# Patient Record
Sex: Female | Born: 1960 | Race: White | Hispanic: No | Marital: Married | Smoking: Never smoker
Health system: Southern US, Community
[De-identification: ages and names within clinical notes are randomized; demographics above are authoritative.]

## PROBLEM LIST (undated history)

## (undated) DIAGNOSIS — E785 Hyperlipidemia, unspecified: Secondary | ICD-10-CM

## (undated) DIAGNOSIS — G43909 Migraine, unspecified, not intractable, without status migrainosus: Secondary | ICD-10-CM

## (undated) HISTORY — DX: Migraine, unspecified, not intractable, without status migrainosus: G43.909

## (undated) HISTORY — DX: Hyperlipidemia, unspecified: E78.5

## (undated) HISTORY — PX: NO PAST SURGERIES: SHX2092

---

## 2011-06-25 ENCOUNTER — Ambulatory Visit (INDEPENDENT_AMBULATORY_CARE_PROVIDER_SITE_OTHER): Payer: BC Managed Care – PPO

## 2011-06-25 DIAGNOSIS — J111 Influenza due to unidentified influenza virus with other respiratory manifestations: Secondary | ICD-10-CM

## 2011-11-11 ENCOUNTER — Ambulatory Visit (INDEPENDENT_AMBULATORY_CARE_PROVIDER_SITE_OTHER): Payer: BC Managed Care – PPO | Admitting: Family Medicine

## 2011-11-11 VITALS — BP 137/100 | HR 90 | Temp 98.1°F | Resp 18

## 2011-11-11 DIAGNOSIS — N920 Excessive and frequent menstruation with regular cycle: Secondary | ICD-10-CM

## 2011-11-11 LAB — POCT CBC
HCT, POC: 42.2 % (ref 37.7–47.9)
Hemoglobin: 13.5 g/dL (ref 12.2–16.2)
Lymph, poc: 2.2 (ref 0.6–3.4)
MCH, POC: 28.2 pg (ref 27–31.2)
MCHC: 32 g/dL (ref 31.8–35.4)
MCV: 88 fL (ref 80–97)
MPV: 10 fL (ref 0–99.8)
POC MID %: 5 %M (ref 0–12)
WBC: 6 10*3/uL (ref 4.6–10.2)

## 2011-11-11 NOTE — Progress Notes (Signed)
Subjective: 51 year old lady with history of having her menstrual cycle start yesterday. She usually goes about 21 days between periods, this time was 19. She usually bleeds for about 4 days. A couple times a year she may have heavy., But this is unusual. 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 She passed some very large clots, and bled too heavy to keep a tampon in. She had to use a towel to catch the blood. This morning she still past a large amount, though over the last couple of hours it seems to have slowed down. She has not had major pain but she has had some soreness in her low abdomen there is cramping with this. She has not really taken anything much for it for fear of precipitating more bleeding. She's not any any other systemic symptoms such as fever, abdominal pain, nausea or vomiting, or urinary symptoms.  She has talked with her gynecologist the past not doing something to stop her menses, and wishes to discuss little bit more.  Objective: Alert, color good. Repeat blood pressure 130/94 abdomen soft, nontender. No CVA tenderness. Pelvic exam not done at this time.  Assessment: Excessive menses  Plan: Check CBC, then decide her recommendations.   Results for orders placed in visit on 11/11/11  POCT CBC      Component Value Range   WBC 6.0  4.6 - 10.2 (K/uL)   Lymph, poc 2.2  0.6 - 3.4    POC LYMPH PERCENT 36.0  10 - 50 (%L)   MID (cbc) 0.3  0 - 0.9    POC MID % 5.0  0 - 12 (%M)   POC Granulocyte 3.5  2 - 6.9    Granulocyte percent 59.0  37 - 80 (%G)   RBC 4.79  4.04 - 5.48 (M/uL)   Hemoglobin 13.5  12.2 - 16.2 (g/dL)   HCT, POC 40.9  81.1 - 47.9 (%)   MCV 88.0  80 - 97 (fL)   MCH, POC 28.2  27 - 31.2 (pg)   MCHC 32.0  31.8 - 35.4 (g/dL)   RDW, POC 91.4     Platelet Count, POC 404  142 - 424 (K/uL)   MPV 10.0  0 - 99.8 (fL)   Discussed the option of endometrial ablation with the patient. She is going to think about that. Reassured her  that her hemoglobin is safe right now, and I do not believe any treatment is necessary today at this time. I will be around some hours for the next for 5 days in the office in his chest problems or concerns I can help with she is to let me know. We could give her around of Provera if she started bleeding more heavily again, but I do not think that is needed at this time.

## 2011-11-11 NOTE — Patient Instructions (Signed)

## 2012-05-04 ENCOUNTER — Ambulatory Visit (INDEPENDENT_AMBULATORY_CARE_PROVIDER_SITE_OTHER): Payer: BC Managed Care – PPO | Admitting: Physician Assistant

## 2012-05-04 VITALS — BP 122/83 | HR 88 | Temp 97.5°F | Resp 16 | Ht 67.0 in | Wt 170.0 lb

## 2012-05-04 DIAGNOSIS — G43909 Migraine, unspecified, not intractable, without status migrainosus: Secondary | ICD-10-CM | POA: Insufficient documentation

## 2012-05-04 DIAGNOSIS — J329 Chronic sinusitis, unspecified: Secondary | ICD-10-CM

## 2012-05-04 DIAGNOSIS — R51 Headache: Secondary | ICD-10-CM

## 2012-05-04 MED ORDER — IPRATROPIUM BROMIDE 0.03 % NA SOLN: 2.0000 | Freq: Two times a day (BID) | NASAL | Status: DC

## 2012-05-04 MED ORDER — AMOXICILLIN-POT CLAVULANATE 875-125 MG PO TABS: 1.0000 | ORAL_TABLET | Freq: Two times a day (BID) | ORAL | Status: DC

## 2012-05-04 NOTE — Patient Instructions (Signed)
Get plenty of rest and drink at least 64 ounces of water daily. 

## 2012-05-04 NOTE — Progress Notes (Signed)
  Subjective:    Patient ID: Candice Bush, female    DOB: 06/18/1961, 51 y.o.   MRN: [id number]  HPI This 51 y.o. female presents for evaluation of illness. Began 10/30 with a HA, which she thought was a migraine.  Nausea, sharp, shooting pain in the temples, sensitive to smells and light.  Symptoms primarily on the RIGHT side initially, now bilaterally and associated with sinus pressure, facial pain, dental pain. Bilateral ear pain. OTC products without benefit. No sore throat, no cough.  Has noted that her mucous is now colored.    Review of Systems As above.   Past Medical History  Diagnosis Date   Migraine Headache   . Hyperlipidemia     History reviewed. No pertinent past surgical history.  Prior to Admission medications   Medication Sig Start Date End Date Taking? Authorizing Provider  aspirin 81 MG tablet Take 81 mg by mouth daily.   Yes Historical Provider, MD  dextromethorphan-guaiFENesin (MUCINEX DM) 30-600 MG per 12 hr tablet Take 1 tablet by mouth every 12 (twelve) hours.   Yes Historical Provider, MD  mometasone (NASONEX) 50 MCG/ACT nasal spray Place 2 sprays into the nose daily.   Yes Historical Provider, MD  fish oil-omega-3 fatty acids 1000 MG capsule Take 2 g by mouth daily.    Historical Provider, MD    No Known Allergies  History   Social History  . Marital Status: Married    Number of Children: 0   Occupational History  . Teacher-Reading coach     [employer]   Social History Main Topics  . Smoking status: Never Smoker   . Smokeless tobacco: Never Used  . Alcohol Use: Yes     occasionally  . Drug Use: No  . Sexually Active: Not Currently   Other Topics Concern  . Not on file   Social History Narrative   Lives with husband, who is a Curator with [employer].    Family History  Problem Relation Age of Onset  . Hypertension Mother   . Hyperlipidemia Mother   . Hypertension Sister        Objective:   Physical Exam  Blood pressure  122/83, pulse 88, temperature 97.5 F (36.4 C), temperature source Oral, resp. rate 16, height 5\' 7"  (1.702 m), weight 170 lb (77.111 kg), last menstrual period 04/13/2012, SpO2 100.00%. Body mass index is 26.63 kg/(m^2). Well-developed, well nourished WF who is awake, alert and oriented, in NAD. HEENT: [place]/AT, PERRL, EOMI.  Sclera and conjunctiva are clear.  Fundi are normal bilaterally. EAC are patent, TMs are normal in appearance. Nasal mucosa is pink and moist. OP is clear. Mild frontal and maxillary sinus tenderness bilaterally. Neck: supple, non-tender, no lymphadenopathy, thyromegaly. Heart: RRR, no murmur Lungs: normal effort, CTA Extremities: no cyanosis, clubbing or edema. Skin: warm and dry without rash. Psychologic: good mood and appropriate affect, normal speech and behavior.     Assessment & Plan:   1. HA (headache)    2. Sinusitis  ipratropium (ATROVENT) 0.03 % nasal spray, amoxicillin-clavulanate (AUGMENTIN) 875-125 MG per tablet

## 2013-06-03 ENCOUNTER — Other Ambulatory Visit: Payer: Self-pay | Admitting: Physician Assistant

## 2018-01-04 ENCOUNTER — Telehealth: Payer: Self-pay | Admitting: *Deleted

## 2018-01-04 NOTE — Telephone Encounter (Signed)
Received Medical records from [facility];  forwarded to provider/SLS 07/05

## 2018-01-17 ENCOUNTER — Ambulatory Visit: Payer: Self-pay | Admitting: Family Medicine

## 2018-01-28 ENCOUNTER — Ambulatory Visit: Payer: Self-pay | Admitting: Family Medicine

## 2018-02-01 ENCOUNTER — Ambulatory Visit: Payer: Self-pay | Admitting: Family Medicine

## 2018-02-14 ENCOUNTER — Encounter: Payer: Self-pay | Admitting: Family Medicine

## 2018-02-14 ENCOUNTER — Ambulatory Visit: Payer: BC Managed Care – PPO | Admitting: Family Medicine

## 2018-02-14 VITALS — BP 118/86 | HR 81 | Temp 98.6°F | Ht 67.0 in | Wt 179.5 lb

## 2018-02-14 DIAGNOSIS — M79644 Pain in right finger(s): Secondary | ICD-10-CM | POA: Insufficient documentation

## 2018-02-14 DIAGNOSIS — F411 Generalized anxiety disorder: Secondary | ICD-10-CM | POA: Diagnosis not present

## 2018-02-14 MED ORDER — SERTRALINE HCL 50 MG PO TABS
50.0000 mg | ORAL_TABLET | Freq: Every day | ORAL | 3 refills | Status: DC
Start: 2018-02-14 — End: 2018-04-22

## 2018-02-14 NOTE — Patient Instructions (Addendum)
Please consider counseling. Contact [phone] to schedule an appointment or inquire about cost/insurance coverage.  Coping skills Choose 5 that work for you:  Take a deep breath  Count to 20  Read a book  Do a puzzle  Meditate  Bake  Sing  Knit  Garden  Pray  Go outside  Call a friend  Listen to music  Take a walk  Color  Send a note  Take a bath  Watch a movie  Be alone in a quiet place  Pet an animal  Visit a friend  Journal  Exercise  Stretch   Let us know if you need anything.  

## 2018-02-14 NOTE — Progress Notes (Signed)
Pre visit review using our clinic review tool, if applicable. No additional management support is needed unless otherwise documented below in the visit note. 

## 2018-02-14 NOTE — Progress Notes (Signed)
Chief Complaint  Patient presents with  . New Patient (Initial Visit)       New Patient Visit SUBJECTIVE: HPI: Candice Bush is an 57 y.o.female who is being seen for establishing care.  Previously seen only at GYN.  +hx of anxiety; having palpitations, never relaxed, impulsive, tense, grinds  Teeth, psychomotor agitation. Started after college. Will sometimes have racing thoughts that affect sleep. This has been getting worse with age. Famhx-none; Stressors- worries about dog, increased responsibility. Has never been treated in the past. Has not tried any OTC supp/options. Does not follow with a counselor or psychologist.  Hx of OA of R thumb. Using Aleve. Was given 3 mo supply of prednisone from [facility] (?). Has seen ortho for this, no surg on horizon at this time. Has had injections that did not help and caused considerable pain for her.    No Known Allergies  Past Medical History:  Diagnosis Date  . Hyperlipidemia   . Migraine    History reviewed. No pertinent surgical history. Family History  Problem Relation Age of Onset  . Hypertension Mother   . Hyperlipidemia Mother   . Alzheimer's disease Mother   . Cancer Mother        colon cancer  . Hypertension Sister    No Known Allergies  Current Outpatient Medications:  .  aspirin 81 MG tablet, Take 81 mg by mouth daily., Disp: , Rfl:  .  dextromethorphan-guaiFENesin (MUCINEX DM) 30-600 MG per 12 hr tablet, Take 1 tablet by mouth every 12 (twelve) hours., Disp: , Rfl:  .  fish oil-omega-3 fatty acids 1000 MG capsule, Take 2 g by mouth daily., Disp: , Rfl:  .  fluticasone (FLONASE) 50 MCG/ACT nasal spray, Place into both nostrils daily., Disp: , Rfl:  .  sertraline (ZOLOFT) 50 MG tablet, Take 1 tablet (50 mg total) by mouth daily. Take 1/2 tab daily for first 2 weeks., Disp: 30 tablet, Rfl: 3   ROS MSK: +thumb pain  Psych: +anxiety   OBJECTIVE: BP 118/86 (BP Location: Left Arm, Patient Position: Sitting, Cuff Size: Normal)    Pulse 81   Temp 98.6 F (37 C) (Oral)   Ht 5\' 7"  (1.702 m)   Wt 179 lb 8 oz (81.4 kg)   SpO2 98%   BMI 28.11 kg/m   Constitutional: -  VS reviewed -  Well developed, well nourished, appears stated age -  No apparent distress  Psychiatric: -  Oriented to person, place, and time -  Memory intact -  Affect and mood normal -  Fluent conversation, good eye contact -  Judgment and insight age appropriate  Eye: -  Conjunctivae clear, no discharge -  Pupils symmetric, round, reactive to light  ENMT: -  MMM    Pharynx moist, no exudate, no erythema  Neck: -  No gross swelling, no palpable masses -  Thyroid midline, not enlarged, mobile, no palpable masses  Cardiovascular: -  RRR -  No LE edema  Respiratory: -  Normal respiratory effort, no accessory muscle use, no retraction -  Breath sounds equal, no wheezes, no ronchi, no crackles  Neurological:  -  CN II - XII grossly intact -  DTR's equal and symmetric -  Sensation grossly intact to light touch, equal bilaterally  Musculoskeletal: -  No clubbing, no cyanosis -  5/5 strength throughout -  Gait normal  Skin: -  No significant lesion on inspection -  Warm and dry to palpation   ASSESSMENT/PLAN: GAD (generalized anxiety  disorder) - Plan: sertraline (ZOLOFT) 50 MG tablet  Pain of right thumb  Patient instructed to sign release of records form from her previous PCP. Start SSRI, 25 mg/d for 2 weeks then 50 mg/d. Counseled on common potential side effects. # for LB BH provided as well as anxiety coping mechs. Counseled on exercise. Offered Mobic, pt would like to stay with Aleve and let us know if anything changes. Patient should return in 6 weeks. The patient voiced understanding and agreement to the plan.   Jilda Rocheicholas Paul [place], DO 02/14/18  8:09 AM

## 2018-04-02 ENCOUNTER — Telehealth: Payer: Self-pay | Admitting: Family Medicine

## 2018-04-02 MED ORDER — VALACYCLOVIR HCL 500 MG PO TABS: ORAL_TABLET | ORAL | 1 refills | Status: DC

## 2018-04-02 NOTE — Telephone Encounter (Signed)
Requesting Valtrex for fever blister, as prescribed by her previous provider. She forgot to mention to Dr. Carmelia Roller last OV to establish care on 02/14/18.

## 2018-04-02 NOTE — Telephone Encounter (Signed)
Copied from CRM [id number]. Topic: Quick Communication - Rx Refill/Question >> Apr 02, 2018 11:50 AM Mickel Baas B, NT wrote: **States that her previous doctor prescribed this medication for fever blisters, but forgot to tell Dr Carmelia Roller about this prescription. Unsure what the MG is because she is at work and does not have it in front of her.**  Medication: Valtrex for fever blister  Has the patient contacted their pharmacy? No. New to Dr Carmelia Roller (Agent: If no, request that the patient contact the pharmacy for the refill.) (Agent: If yes, when and what did the pharmacy advise?)  Preferred Pharmacy (with phone number or street name): [id number] - [place], [place] - [address]  Agent: Please be advised that RX refills may take up to 3 business days. We ask that you follow-up with your pharmacy.

## 2018-04-02 NOTE — Telephone Encounter (Signed)
Ordered

## 2018-04-02 NOTE — Telephone Encounter (Signed)
Pt. Requesting valtrex.

## 2018-04-08 ENCOUNTER — Ambulatory Visit: Payer: BC Managed Care – PPO | Admitting: Family Medicine

## 2018-04-22 ENCOUNTER — Encounter: Payer: Self-pay | Admitting: Family Medicine

## 2018-04-22 ENCOUNTER — Ambulatory Visit: Payer: BC Managed Care – PPO | Admitting: Family Medicine

## 2018-04-22 VITALS — BP 120/84 | HR 84 | Temp 97.6°F | Ht 67.0 in | Wt 180.1 lb

## 2018-04-22 DIAGNOSIS — H6591 Unspecified nonsuppurative otitis media, right ear: Secondary | ICD-10-CM | POA: Diagnosis not present

## 2018-04-22 DIAGNOSIS — F411 Generalized anxiety disorder: Secondary | ICD-10-CM

## 2018-04-22 MED ORDER — PREDNISONE 20 MG PO TABS: 40.0000 mg | ORAL_TABLET | Freq: Every day | ORAL | 0 refills | Status: AC

## 2018-04-22 MED ORDER — SERTRALINE HCL 25 MG PO TABS: 25.0000 mg | ORAL_TABLET | Freq: Every day | ORAL | 1 refills | Status: DC

## 2018-04-22 NOTE — Patient Instructions (Addendum)
Think about if/when you would like to come off of the Zoloft.   If you want to come off of it, schedule an appt in around 3 mo (January).  Continue on Flonase.  Let us know if you need anything.

## 2018-04-22 NOTE — Progress Notes (Signed)
Pre visit review using our clinic review tool, if applicable. No additional management support is needed unless otherwise documented below in the visit note. 

## 2018-04-22 NOTE — Progress Notes (Signed)
Chief Complaint  Patient presents with  . Follow-up    Subjective Candice Bush presents for f/u anxiety.  She is currently being treated with Zoloft 50 mg/d, started 6 weeks ago.  Reports good improvement since treatment- best with 25 mg/d No thoughts of harming self or others. No self-medication with alcohol, prescription drugs or illicit drugs. Pt is not following with a counselor/psychologist.  Several d hx of b/l ear pain, worse on L. Does use INCS daily. Travels soon. No fevers or other URI s/s's.   ROS Psych: No homicidal or suicidal thoughts HEENT: +ear pain  Past Medical History:  Diagnosis Date  . Hyperlipidemia   . Migraine    Exam BP 120/84 (BP Location: Left Arm, Patient Position: Sitting, Cuff Size: Normal)   Pulse 84   Temp 97.6 F (36.4 C) (Oral)   Ht 5\' 7"  (1.702 m)   Wt 180 lb 2 oz (81.7 kg)   SpO2 95%   BMI 28.21 kg/m  General:  well developed, well nourished, in no apparent distress HEENT: Ear canals neg, +serous fluid on R, some serous fluid on L, no bulging or erythema. Nares patent. Pharynx pink w/o exudates. Neck: neck supple without adenopathy, thyromegaly, or masses Lungs:  clear to auscultation, breath sounds equal bilaterally, no respiratory distress Cardio:  regular rate and rhythm without murmurs, heart sounds without clicks or rubs Psych: well oriented with normal range of affect and age-appropriate judgement/insight, alert and oriented x4.  Assessment and Plan  GAD (generalized anxiety disorder) - Plan: sertraline (ZOLOFT) 25 MG tablet  Fluid level behind tympanic membrane of right ear - Plan: predniSONE (DELTASONE) 20 MG tablet  OK to stay on 25 mg Zoloft daily. Suggested she think about coming off. I will plan to see in 6 mo, return in 3 if she would like to come off of med.  Cont INCS, 5 d pred burst for ears.  F/u in 6 mo for CPE. The patient voiced understanding and agreement to the plan.  Jilda Roche [place],  DO 04/22/18 4:24 PM

## 2018-05-07 ENCOUNTER — Encounter: Payer: Self-pay | Admitting: Family Medicine

## 2018-08-04 ENCOUNTER — Encounter: Payer: Self-pay | Admitting: Family Medicine

## 2018-10-31 ENCOUNTER — Telehealth: Payer: Self-pay

## 2018-10-31 ENCOUNTER — Telehealth: Payer: Self-pay | Admitting: Family Medicine

## 2018-10-31 NOTE — Telephone Encounter (Signed)
Pt returned call. Pt has been scheduled for July.

## 2018-10-31 NOTE — Telephone Encounter (Signed)
Copied from CRM [id number]. Topic: General - Other >> Oct 31, 2018  3:22 PM Trula Slade wrote: Reason for CRM:  Patient would like to set up a physical at the end of July.  Tried 3 times to reach office.

## 2018-10-31 NOTE — Telephone Encounter (Signed)
LVM for patient to return call to schedule annual physical and lab work for sometime in July per [employer].

## 2018-10-31 NOTE — Telephone Encounter (Signed)
Returned patients call for appointment scheduling with Dr. Carmelia Roller. Left message on both answering machines.

## 2018-11-18 ENCOUNTER — Other Ambulatory Visit: Payer: Self-pay | Admitting: Family Medicine

## 2018-11-18 DIAGNOSIS — F411 Generalized anxiety disorder: Secondary | ICD-10-CM

## 2019-01-27 ENCOUNTER — Ambulatory Visit (INDEPENDENT_AMBULATORY_CARE_PROVIDER_SITE_OTHER): Payer: BC Managed Care – PPO | Admitting: Family Medicine

## 2019-01-27 ENCOUNTER — Other Ambulatory Visit: Payer: Self-pay

## 2019-01-27 ENCOUNTER — Encounter: Payer: BC Managed Care – PPO | Admitting: Family Medicine

## 2019-01-27 ENCOUNTER — Encounter: Payer: Self-pay | Admitting: Family Medicine

## 2019-01-27 VITALS — BP 110/68 | HR 77 | Temp 98.2°F | Ht 66.0 in | Wt 154.5 lb

## 2019-01-27 DIAGNOSIS — Z114 Encounter for screening for human immunodeficiency virus [HIV]: Secondary | ICD-10-CM

## 2019-01-27 DIAGNOSIS — Z0000 Encounter for general adult medical examination without abnormal findings: Secondary | ICD-10-CM

## 2019-01-27 DIAGNOSIS — Z1159 Encounter for screening for other viral diseases: Secondary | ICD-10-CM | POA: Diagnosis not present

## 2019-01-27 LAB — CBC
HCT: 41.9 % (ref 36.0–46.0)
Hemoglobin: 14 g/dL (ref 12.0–15.0)
MCHC: 33.4 g/dL (ref 30.0–36.0)
MCV: 88.7 fl (ref 78.0–100.0)
Platelets: 281 10*3/uL (ref 150.0–400.0)
RBC: 4.72 Mil/uL (ref 3.87–5.11)
RDW: 12.9 % (ref 11.5–15.5)
WBC: 4 10*3/uL (ref 4.0–10.5)

## 2019-01-27 LAB — COMPREHENSIVE METABOLIC PANEL
ALT: 15 U/L (ref 0–35)
AST: 15 U/L (ref 0–37)
Albumin: 4.5 g/dL (ref 3.5–5.2)
Alkaline Phosphatase: 52 U/L (ref 39–117)
BUN: 16 mg/dL (ref 6–23)
CO2: 30 mEq/L (ref 19–32)
Calcium: 9.8 mg/dL (ref 8.4–10.5)
Chloride: 105 mEq/L (ref 96–112)
Creatinine, Ser: 0.83 mg/dL (ref 0.40–1.20)
GFR: 71.1 mL/min (ref >60.00)
Glucose, Bld: 86 mg/dL (ref 70–99)
Potassium: 4.7 mEq/L (ref 3.5–5.1)
Sodium: 142 mEq/L (ref 135–145)
Total Bilirubin: 0.5 mg/dL (ref 0.2–1.2)
Total Protein: 6.6 g/dL (ref 6.0–8.3)

## 2019-01-27 LAB — LIPID PANEL
Cholesterol: 216 mg/dL — ABNORMAL HIGH (ref 0–200)
HDL: 48.5 mg/dL (ref >39.00)
LDL Cholesterol: 143 mg/dL — ABNORMAL HIGH (ref 0–99)
NonHDL: 167.8
Total CHOL/HDL Ratio: 4
Triglycerides: 124 mg/dL (ref 0.0–149.0)
VLDL: 24.8 mg/dL (ref 0.0–40.0)

## 2019-01-27 NOTE — Progress Notes (Signed)
Chief Complaint  Patient presents with  . Annual Exam     Well Woman Candice Bush is here for a complete physical.   Her last physical was >1 year ago.  Current diet: in general, a "healthy" diet. Current exercise: walking, yoga, wt resistance. Weight is decreasing intentionally and she denies daytime fatigue. No LMP recorded. Seatbelt? Yes  Health Maintenance Pap/HPV- Yes Mammogram- Getting tomorrow Tetanus- Yes Hep C screening- No HIV screening- No  Past Medical History:  Diagnosis Date  . Hyperlipidemia   . Migraine      Past Surgical History:  Procedure Laterality Date  . NO PAST SURGERIES      Medications  Current Outpatient Medications on File Prior to Visit  Medication Sig Dispense Refill  . aspirin 81 MG tablet Take 81 mg by mouth daily.    Marland Kitchen. dextromethorphan-guaiFENesin (MUCINEX DM) 30-600 MG per 12 hr tablet Take 1 tablet by mouth every 12 (twelve) hours.    . fish oil-omega-3 fatty acids 1000 MG capsule Take 2 g by mouth daily.    . fluticasone (FLONASE) 50 MCG/ACT nasal spray Place into both nostrils daily.    . sertraline (ZOLOFT) 25 MG tablet Take 1 tablet (25 mg total) by mouth daily. 90 tablet 1  . valACYclovir (VALTREX) 500 MG tablet Take 2000 mg and repeat dose in 12 hours for fever blister. 30 tablet 1   Allergies No Known Allergies  Review of Systems: Constitutional:  no unexpected weight changes Eye:  no recent significant change in vision Ear/Nose/Mouth/Throat:  Ears:  no tinnitus or vertigo and no recent change in hearing Nose/Mouth/Throat:  no complaints of nasal congestion, no sore throat Cardiovascular: no chest pain Respiratory:  no cough and no shortness of breath Gastrointestinal:  no abdominal pain, no change in bowel habits GU:  Female: negative for dysuria or pelvic pain Musculoskeletal/Extremities: +knee buckling b/l; otherwise no pain of the joints Integumentary (Skin/Breast):  no abnormal skin lesions reported Neurologic:  no  headaches Endocrine:  denies fatigue Hematologic/Lymphatic:  + various areas of easy bruising Psych: +insomnia  Exam BP 110/68 (BP Location: Left Arm, Patient Position: Sitting, Cuff Size: Normal)   Pulse 77   Temp 98.2 F (36.8 C)   Ht 5\' 6"  (1.676 m)   Wt 154 lb 8 oz (70.1 kg)   SpO2 98%   BMI 24.94 kg/m  General:  well developed, well nourished, in no apparent distress Skin:  no significant moles, warts, or growths Head:  no masses, lesions, or tenderness Eyes:  pupils equal and round, sclera anicteric without injection Ears:  canals without lesions, TMs shiny without retraction, no obvious effusion, no erythema Nose:  nares patent, septum midline, mucosa normal, and no drainage or sinus tenderness Throat/Pharynx:  lips and gingiva without lesion; tongue and uvula midline; non-inflamed pharynx; no exudates or postnasal drainage Neck: neck supple without adenopathy, thyromegaly, or masses Lungs:  clear to auscultation, breath sounds equal bilaterally, no respiratory distress Cardio:  regular rate and rhythm, no bruits, no LE edema Abdomen:  abdomen soft, nontender; bowel sounds normal; no masses or organomegaly Genital: Defer to GYN Musculoskeletal:  symmetrical muscle groups noted without atrophy or deformity Extremities:  no clubbing, cyanosis, or edema, no deformities, no skin discoloration Neuro:  gait normal; deep tendon reflexes normal and symmetric Psych: well oriented with normal range of affect and appropriate judgment/insight  Assessment and Plan  Well adult exam - Plan: CBC, Comprehensive metabolic panel, Lipid panel  Screening for HIV (human immunodeficiency virus) -  Plan: HIV Antibody (routine testing w rflx)  Encounter for hepatitis C screening test for low risk patient - Plan: Hepatitis C antibody   Well 58 y.o. female. Counseled on diet and exercise. She blames insomnia on racing thoughts from upcoming retirement. Will revisit in fall if still having  issues. Will give stretches/exercises for buckling knees. She has had eval around 1 mo ago and had neg workup. No pain.  Has come off of Zoloft, doing well (maybe other than the sleep). Offered med for sleep, preferred to see how things go after she retires on 9/1.  Other orders as above. Follow up in 1 yr or prn. The patient voiced understanding and agreement to the plan.  [place], DO 01/27/19 8:27 AM

## 2019-01-27 NOTE — Patient Instructions (Addendum)
Give us 2-3 business days to get the results of your labs back.   Keep the diet clean and stay active.  Let us know if you need anything.  Knee Exercises It is normal to feel mild stretching, pulling, tightness, or discomfort as you do these exercises, but you should stop right away if you feel sudden pain or your pain gets worse. STRETCHING AND RANGE OF MOTION EXERCISES  These exercises warm up your muscles and joints and improve the movement and flexibility of your knee. These exercises also help to relieve pain, numbness, and tingling. Exercise A: Knee Extension, Prone  1. Lie on your abdomen on a bed. 2. Place your left / right knee just beyond the edge of the surface so your knee is not on the bed. You can put a towel under your left / right thigh just above your knee for comfort. 3. Relax your leg muscles and allow gravity to straighten your knee. You should feel a stretch behind your left / right knee. 4. Hold this position for 30 seconds. 5. Scoot up so your knee is supported between repetitions. Repeat 2 times. Complete this stretch 3 times per week. Exercise B: Knee Flexion, Active    1. Lie on your back with both knees straight. If this causes back discomfort, bend your left / right knee so your foot is flat on the floor. 2. Slowly slide your left / right heel back toward your buttocks until you feel a gentle stretch in the front of your knee or thigh. 3. Hold this position for 30 seconds. 4. Slowly slide your left / right heel back to the starting position. Repeat 2 times. Complete this exercise 3 times per week. Exercise C: Quadriceps, Prone    1. Lie on your abdomen on a firm surface, such as a bed or padded floor. 2. Bend your left / right knee and hold your ankle. If you cannot reach your ankle or pant leg, loop a belt around your foot and grab the belt instead. 3. Gently pull your heel toward your buttocks. Your knee should not slide out to the side. You should feel  a stretch in the front of your thigh and knee. 4. Hold this position for 30 seconds. Repeat 2 times. Complete this stretch 3 times per week. Exercise D: Hamstring, Supine  1. Lie on your back. 2. Loop a belt or towel over the ball of your left / right foot. The ball of your foot is on the walking surface, right under your toes. 3. Straighten your left / right knee and slowly pull on the belt to raise your leg until you feel a gentle stretch behind your knee. ? Do not let your left / right knee bend while you do this. ? Keep your other leg flat on the floor. 4. Hold this position for 30 seconds. Repeat 2 times. Complete this stretch 3 times per week. STRENGTHENING EXERCISES  These exercises build strength and endurance in your knee. Endurance is the ability to use your muscles for a long time, even after they get tired. Exercise E: Quadriceps, Isometric    1. Lie on your back with your left / right leg extended and your other knee bent. Put a rolled towel or small pillow under your knee if told by your health care provider. 2. Slowly tense the muscles in the front of your left / right thigh. You should see your kneecap slide up toward your hip or see increased dimpling just   above the knee. This motion will push the back of the knee toward the floor. 3. For 3 seconds, keep the muscle as tight as you can without increasing your pain. 4. Relax the muscles slowly and completely. Repeat for 10 total reps Repeat 2 ti mes. Complete this exercise 3 times per week. Exercise F: Straight Leg Raises - Quadriceps  1. Lie on your back with your left / right leg extended and your other knee bent. 2. Tense the muscles in the front of your left / right thigh. You should see your kneecap slide up or see increased dimpling just above the knee. Your thigh may even shake a bit. 3. Keep these muscles tight as you raise your leg 4-6 inches (10-15 cm) off the floor. Do not let your knee bend. 4. Hold this position  for 3 seconds. 5. Keep these muscles tense as you lower your leg. 6. Relax your muscles slowly and completely after each repetition. 10 total reps. Repeat 2 times. Complete this exercise 3 times per week.  Exercise G: Hamstring Curls    If told by your health care provider, do this exercise while wearing ankle weights. Begin with 5 lb weights (optional). Then increase the weight by 1 lb (0.5 kg) increments. Do not wear ankle weights that are more than 20 lbs to start with. 1. Lie on your abdomen with your legs straight. 2. Bend your left / right knee as far as you can without feeling pain. Keep your hips flat against the floor. 3. Hold this position for 3 seconds. 4. Slowly lower your leg to the starting position. Repeat for 10 reps.  Repeat 2 times. Complete this exercise 3 times per week. Exercise H: Squats (Quadriceps)  1. Stand in front of a table, with your feet and knees pointing straight ahead. You may rest your hands on the table for balance but not for support. 2. Slowly bend your knees and lower your hips like you are going to sit in a chair. ? Keep your weight over your heels, not over your toes. ? Keep your lower legs upright so they are parallel with the table legs. ? Do not let your hips go lower than your knees. ? Do not bend lower than told by your health care provider. ? If your knee pain increases, do not bend as low. 3. Hold the squat position for 1 second. 4. Slowly push with your legs to return to standing. Do not use your hands to pull yourself to standing. Repeat 2 times. Complete this exercise 3 times per week. Exercise I: Wall Slides (Quadriceps)    1. Lean your back against a smooth wall or door while you walk your feet out 18-24 inches (46-61 cm) from it. 2. Place your feet hip-width apart. 3. Slowly slide down the wall or door until your knees Repeat 2 times. Complete this exercise every other day. 4. Exercise K: Straight Leg Raises - Hip Abductors   1. Lie on your side with your left / right leg in the top position. Lie so your head, shoulder, knee, and hip line up. You may bend your bottom knee to help you keep your balance. 2. Roll your hips slightly forward so your hips are stacked directly over each other and your left / right knee is facing forward. 3. Leading with your heel, lift your top leg 4-6 inches (10-15 cm). You should feel the muscles in your outer hip lifting. ? Do not let your foot drift forward. ?   Do not let your knee roll toward the ceiling. 4. Hold this position for 3 seconds. 5. Slowly return your leg to the starting position. 6. Let your muscles relax completely after each repetition. 10 total reps. Repeat 2 times. Complete this exercise 3 times per week. Exercise J: Straight Leg Raises - Hip Extensors  1. Lie on your abdomen on a firm surface. You can put a pillow under your hips if that is more comfortable. 2. Tense the muscles in your buttocks and lift your left / right leg about 4-6 inches (10-15 cm). Keep your knee straight as you lift your leg. 3. Hold this position for 3 seconds. 4. Slowly lower your leg to the starting position. 5. Let your leg relax completely after each repetition. Repeat 2 times. Complete this exercise 3 times per week. Document Released: 05/03/2005 Document Revised: 03/13/2016 Document Reviewed: 04/25/2015 Elsevier Interactive Patient Education  2017 [employer]   

## 2019-01-28 ENCOUNTER — Encounter: Payer: Self-pay | Admitting: Family Medicine

## 2019-01-28 LAB — HIV ANTIBODY (ROUTINE TESTING W REFLEX): HIV 1&2 Ab, 4th Generation: NONREACTIVE

## 2019-01-28 LAB — HEPATITIS C ANTIBODY
Hepatitis C Ab: NONREACTIVE
SIGNAL TO CUT-OFF: 0.01 (ref <1.00)

## 2019-02-05 ENCOUNTER — Other Ambulatory Visit: Payer: Self-pay | Admitting: Obstetrics and Gynecology

## 2019-02-05 DIAGNOSIS — R928 Other abnormal and inconclusive findings on diagnostic imaging of breast: Secondary | ICD-10-CM

## 2019-02-07 ENCOUNTER — Ambulatory Visit
Admission: RE | Admit: 2019-02-07 | Discharge: 2019-02-07 | Disposition: A | Discharge status: Home / Self Care | Payer: BC Managed Care – PPO | Source: Ambulatory Visit | Attending: Obstetrics and Gynecology | Admitting: Obstetrics and Gynecology

## 2019-02-07 ENCOUNTER — Other Ambulatory Visit: Payer: Self-pay

## 2019-02-07 ENCOUNTER — Ambulatory Visit: Payer: BC Managed Care – PPO

## 2019-02-07 DIAGNOSIS — R928 Other abnormal and inconclusive findings on diagnostic imaging of breast: Secondary | ICD-10-CM

## 2019-04-02 ENCOUNTER — Encounter: Payer: Self-pay | Admitting: Family Medicine

## 2019-04-02 ENCOUNTER — Other Ambulatory Visit: Payer: Self-pay

## 2019-04-02 ENCOUNTER — Ambulatory Visit: Payer: BC Managed Care – PPO | Admitting: Family Medicine

## 2019-04-02 VITALS — BP 122/78 | HR 78 | Temp 96.9°F | Resp 16 | Ht 66.0 in | Wt 156.0 lb

## 2019-04-02 DIAGNOSIS — J3489 Other specified disorders of nose and nasal sinuses: Secondary | ICD-10-CM

## 2019-04-02 DIAGNOSIS — T7840XA Allergy, unspecified, initial encounter: Secondary | ICD-10-CM

## 2019-04-02 MED ORDER — PREDNISONE 20 MG PO TABS: 40.0000 mg | ORAL_TABLET | Freq: Every day | ORAL | 0 refills | Status: AC

## 2019-04-02 MED ORDER — MOMETASONE FUROATE 50 MCG/ACT NA SUSP: 2.0000 | Freq: Every day | NASAL | 12 refills | Status: DC

## 2019-04-02 NOTE — Patient Instructions (Addendum)
Continue with the air humidifier.  OK to take Tylenol 1000 mg (2 extra strength tabs) or 975 mg (3 regular strength tabs) every 6 hours as needed.  Continue to push fluids, practice good hand hygiene, and cover your mouth if you cough.  If you start having fevers, shaking or shortness of breath, seek immediate care.  Claritin (loratadine), Allegra (fexofenadine), Zyrtec (cetirizine) which is also equivalent to Xyzal (levocetirizine); these are listed in order from weakest to strongest. Generic, and therefore cheaper, options are in the parentheses.   There are available OTC, and the generic versions, which may be cheaper, are in parentheses. Show this to a pharmacist if you have trouble finding any of these items.  Let us know if you need anything.

## 2019-04-02 NOTE — Progress Notes (Signed)
Chief Complaint  Patient presents with  . Sinus Pressure    more frequent headaches, possible weather related, ear pressure bilateral    Candice Bush here for URI complaints.  Duration: 1.5 weeks  Associated symptoms: sinus headache, sinus congestion, sinus pain and ear fullness Denies: rhinorrhea, itchy watery eyes, ear pain, ear drainage, sore throat, wheezing, shortness of breath, myalgia, cough, and fevers Treatment to date: Mucinex, INCS, humidifier Sick contacts: No  She does have a history of migraines but it was mainly when she was going through menopause.  ROS:  Const: Denies fevers HEENT: As noted in HPI Lungs: No SOB  Past Medical History:  Diagnosis Date  . Hyperlipidemia   . Migraine     BP 122/78 (BP Location: Left Arm, Patient Position: Sitting, Cuff Size: Normal)   Pulse 78   Temp (!) 96.9 F (36.1 C) (Temporal)   Resp 16   Ht 5\' 6"  (1.676 m)   Wt 156 lb (70.8 kg)   SpO2 99%   BMI 25.18 kg/m  General: Awake, alert, appears stated age HEENT: AT, [place], ears patent b/l and TM's neg, nares patent w/o discharge, pharynx pink and without exudates, MMM, sinuses are nontender to palpation Neck: No masses or asymmetry Heart: RRR Lungs: CTAB, no accessory muscle use Neuro: DTRs equal and symmetric, no cerebellar signs, no clonus Psych: Age appropriate judgment and insight, normal mood and affect  Sinus pressure - Plan: predniSONE (DELTASONE) 20 MG tablet  Allergic state, initial encounter - Plan: predniSONE (DELTASONE) 20 MG tablet, mometasone (NASONEX) 50 MCG/ACT nasal spray  I think her issues are more related to allergies.  No suggestive of infection as there is no tenderness over the sinuses.  Unlikely to be a migraine given how far away from menopause she is. A list of oral antihistamine medications were provided in her AVS.  Tylenol as needed. F/u prn. If starting to experience fevers, shaking, or shortness of breath, seek immediate care. Pt voiced  understanding and agreement to the plan.  [place], DO 04/02/19 8:45 AM

## 2019-05-22 ENCOUNTER — Other Ambulatory Visit: Payer: Self-pay | Admitting: Family Medicine

## 2019-12-29 ENCOUNTER — Encounter: Payer: Self-pay | Admitting: Family Medicine

## 2019-12-29 ENCOUNTER — Other Ambulatory Visit: Payer: Self-pay

## 2019-12-29 ENCOUNTER — Ambulatory Visit (INDEPENDENT_AMBULATORY_CARE_PROVIDER_SITE_OTHER): Payer: BC Managed Care – PPO | Admitting: Family Medicine

## 2019-12-29 VITALS — BP 120/68 | HR 71 | Temp 96.0°F | Ht 66.5 in | Wt 160.2 lb

## 2019-12-29 DIAGNOSIS — J3489 Other specified disorders of nose and nasal sinuses: Secondary | ICD-10-CM | POA: Diagnosis not present

## 2019-12-29 MED ORDER — MONTELUKAST SODIUM 10 MG PO TABS: ORAL_TABLET | ORAL | 5 refills | Status: DC

## 2019-12-29 MED ORDER — PREDNISONE 20 MG PO TABS: 40.0000 mg | ORAL_TABLET | Freq: Every day | ORAL | 0 refills | Status: AC

## 2019-12-29 NOTE — Patient Instructions (Signed)
Continue the Nasonex and levocertirizine.   Take the Singulair if you start having increasing sinus pressure/allergies.  If things take a turn for the worse, take the prednisone.  Let us know if you need anything.

## 2019-12-29 NOTE — Progress Notes (Signed)
Chief Complaint  Patient presents with  . Headache  . Sore Throat  . Sinusitis    Subjective: Patient is a 59 y.o. female here for headache.  4 weeks of headache. Had work done on R lower tooth that resolved with dental care though HA's remained. Feels a frontal and b/l HA that felt like a tight band. Worse in AM. Tried Excedrin that helped a lot. Scratchy throat, some ear fullness, compresses, humidified air, allergy meds (Nasonex, Xyzal), netty pot, Mucinex-DM. No fevers or neurologic s/s's.    Past Medical History:  Diagnosis Date  . Hyperlipidemia   . Migraine     Objective: BP 120/68 (BP Location: Left Arm, Patient Position: Sitting, Cuff Size: Normal)   Pulse 71   Temp (!) 96 F (35.6 C) (Temporal)   Ht 5' 6.5" (1.689 m)   Wt 160 lb 4 oz (72.7 kg)   SpO2 97%   BMI 25.48 kg/m  General: Awake, appears stated age HEENT: MMM, slight erythema over soft palate suggestive of allergic changes, EOMi, ears neg b/l, nares patent w/o rhinorrhea Heart: RRR Lungs: CTAB, no rales, wheezes or rhonchi. No accessory muscle use Neuro: DTR's equal and symmetric throughout, no clonus, no cerebellar signs. MSK: no ttp over TMJ, cerv parasp msc, occ triangle, temporalis b/l Psych: Age appropriate judgment and insight, normal affect and mood  Assessment and Plan: Sinus pressure - Plan: montelukast (SINGULAIR) 10 MG tablet, predniSONE (DELTASONE) 20 MG tablet  Pred burst prn. Singulair prn. F/u as originally scheduled.  The patient voiced understanding and agreement to the plan.  Jilda Roche [place], DO 12/29/19  11:33 AM

## 2020-01-28 ENCOUNTER — Encounter: Payer: BC Managed Care – PPO | Admitting: Family Medicine

## 2020-03-22 ENCOUNTER — Other Ambulatory Visit: Payer: Self-pay | Admitting: Obstetrics and Gynecology

## 2020-03-22 DIAGNOSIS — R928 Other abnormal and inconclusive findings on diagnostic imaging of breast: Secondary | ICD-10-CM

## 2020-03-30 ENCOUNTER — Encounter: Payer: Self-pay | Admitting: Family Medicine

## 2020-03-30 ENCOUNTER — Ambulatory Visit (INDEPENDENT_AMBULATORY_CARE_PROVIDER_SITE_OTHER): Payer: BC Managed Care – PPO | Admitting: Family Medicine

## 2020-03-30 ENCOUNTER — Other Ambulatory Visit: Payer: Self-pay

## 2020-03-30 VITALS — BP 110/68 | HR 89 | Temp 98.3°F | Ht 67.0 in | Wt 160.4 lb

## 2020-03-30 DIAGNOSIS — Z0000 Encounter for general adult medical examination without abnormal findings: Secondary | ICD-10-CM

## 2020-03-30 DIAGNOSIS — T7840XA Allergy, unspecified, initial encounter: Secondary | ICD-10-CM

## 2020-03-30 MED ORDER — MOMETASONE FUROATE 50 MCG/ACT NA SUSP: 2.0000 | Freq: Every day | NASAL | 12 refills | Status: DC

## 2020-03-30 MED ORDER — VALACYCLOVIR HCL 500 MG PO TABS: ORAL_TABLET | ORAL | 1 refills | Status: DC

## 2020-03-30 NOTE — Patient Instructions (Addendum)
Give us 2-3 business days to get the results of your labs back.   Keep the diet clean and stay active.  Let us know if you need anything.  Knee Exercises It is normal to feel mild stretching, pulling, tightness, or discomfort as you do these exercises, but you should stop right away if you feel sudden pain or your pain gets worse. STRETCHING AND RANGE OF MOTION EXERCISES  These exercises warm up your muscles and joints and improve the movement and flexibility of your knee. These exercises also help to relieve pain, numbness, and tingling. Exercise A: Knee Extension, Prone  1. Lie on your abdomen on a bed. 2. Place your left / right knee just beyond the edge of the surface so your knee is not on the bed. You can put a towel under your left / right thigh just above your knee for comfort. 3. Relax your leg muscles and allow gravity to straighten your knee. You should feel a stretch behind your left / right knee. 4. Hold this position for 30 seconds. 5. Scoot up so your knee is supported between repetitions. Repeat 2 times. Complete this stretch 3 times per week. Exercise B: Knee Flexion, Active    1. Lie on your back with both knees straight. If this causes back discomfort, bend your left / right knee so your foot is flat on the floor. 2. Slowly slide your left / right heel back toward your buttocks until you feel a gentle stretch in the front of your knee or thigh. 3. Hold this position for 30 seconds. 4. Slowly slide your left / right heel back to the starting position. Repeat 2 times. Complete this exercise 3 times per week. Exercise C: Quadriceps, Prone    1. Lie on your abdomen on a firm surface, such as a bed or padded floor. 2. Bend your left / right knee and hold your ankle. If you cannot reach your ankle or pant leg, loop a belt around your foot and grab the belt instead. 3. Gently pull your heel toward your buttocks. Your knee should not slide out to the side. You should feel  a stretch in the front of your thigh and knee. 4. Hold this position for 30 seconds. Repeat 2 times. Complete this stretch 3 times per week. Exercise D: Hamstring, Supine  1. Lie on your back. 2. Loop a belt or towel over the ball of your left / right foot. The ball of your foot is on the walking surface, right under your toes. 3. Straighten your left / right knee and slowly pull on the belt to raise your leg until you feel a gentle stretch behind your knee. ? Do not let your left / right knee bend while you do this. ? Keep your other leg flat on the floor. 4. Hold this position for 30 seconds. Repeat 2 times. Complete this stretch 3 times per week. STRENGTHENING EXERCISES  These exercises build strength and endurance in your knee. Endurance is the ability to use your muscles for a long time, even after they get tired. Exercise E: Quadriceps, Isometric    1. Lie on your back with your left / right leg extended and your other knee bent. Put a rolled towel or small pillow under your knee if told by your health care provider. 2. Slowly tense the muscles in the front of your left / right thigh. You should see your kneecap slide up toward your hip or see increased dimpling just   above the knee. This motion will push the back of the knee toward the floor. 3. For 3 seconds, keep the muscle as tight as you can without increasing your pain. 4. Relax the muscles slowly and completely. Repeat for 10 total reps Repeat 2 ti mes. Complete this exercise 3 times per week. Exercise F: Straight Leg Raises - Quadriceps  1. Lie on your back with your left / right leg extended and your other knee bent. 2. Tense the muscles in the front of your left / right thigh. You should see your kneecap slide up or see increased dimpling just above the knee. Your thigh may even shake a bit. 3. Keep these muscles tight as you raise your leg 4-6 inches (10-15 cm) off the floor. Do not let your knee bend. 4. Hold this position  for 3 seconds. 5. Keep these muscles tense as you lower your leg. 6. Relax your muscles slowly and completely after each repetition. 10 total reps. Repeat 2 times. Complete this exercise 3 times per week.  Exercise G: Hamstring Curls    If told by your health care provider, do this exercise while wearing ankle weights. Begin with 5 lb weights (optional). Then increase the weight by 1 lb (0.5 kg) increments. Do not wear ankle weights that are more than 20 lbs to start with. 1. Lie on your abdomen with your legs straight. 2. Bend your left / right knee as far as you can without feeling pain. Keep your hips flat against the floor. 3. Hold this position for 3 seconds. 4. Slowly lower your leg to the starting position. Repeat for 10 reps.  Repeat 2 times. Complete this exercise 3 times per week. Exercise H: Squats (Quadriceps)  1. Stand in front of a table, with your feet and knees pointing straight ahead. You may rest your hands on the table for balance but not for support. 2. Slowly bend your knees and lower your hips like you are going to sit in a chair. ? Keep your weight over your heels, not over your toes. ? Keep your lower legs upright so they are parallel with the table legs. ? Do not let your hips go lower than your knees. ? Do not bend lower than told by your health care provider. ? If your knee pain increases, do not bend as low. 3. Hold the squat position for 1 second. 4. Slowly push with your legs to return to standing. Do not use your hands to pull yourself to standing. Repeat 2 times. Complete this exercise 3 times per week. Exercise I: Wall Slides (Quadriceps)    1. Lean your back against a smooth wall or door while you walk your feet out 18-24 inches (46-61 cm) from it. 2. Place your feet hip-width apart. 3. Slowly slide down the wall or door until your knees Repeat 2 times. Complete this exercise every other day. 4. Exercise K: Straight Leg Raises - Hip Abductors   1. Lie on your side with your left / right leg in the top position. Lie so your head, shoulder, knee, and hip line up. You may bend your bottom knee to help you keep your balance. 2. Roll your hips slightly forward so your hips are stacked directly over each other and your left / right knee is facing forward. 3. Leading with your heel, lift your top leg 4-6 inches (10-15 cm). You should feel the muscles in your outer hip lifting. ? Do not let your foot drift forward. ?   Do not let your knee roll toward the ceiling. 4. Hold this position for 3 seconds. 5. Slowly return your leg to the starting position. 6. Let your muscles relax completely after each repetition. 10 total reps. Repeat 2 times. Complete this exercise 3 times per week. Exercise J: Straight Leg Raises - Hip Extensors  1. Lie on your abdomen on a firm surface. You can put a pillow under your hips if that is more comfortable. 2. Tense the muscles in your buttocks and lift your left / right leg about 4-6 inches (10-15 cm). Keep your knee straight as you lift your leg. 3. Hold this position for 3 seconds. 4. Slowly lower your leg to the starting position. 5. Let your leg relax completely after each repetition. Repeat 2 times. Complete this exercise 3 times per week. Document Released: 05/03/2005 Document Revised: 03/13/2016 Document Reviewed: 04/25/2015 Elsevier Interactive Patient Education  2017 [employer]   

## 2020-03-30 NOTE — Progress Notes (Signed)
Chief Complaint  Patient presents with  . Annual Exam     Well Woman Candice Bush is here for a complete physical.   Her last physical was >1 year ago.  Current diet: in general, an overall "healthy" diet. Current exercise: walking, lifting. Weight is up a little and she denies fatigue out of ordinary. Seatbelt? Yes  Health Maintenance Pap/HPV- Yes Mammogram- Yes Colon cancer screening-Yes Shingrix- Yes Tetanus- Yes Hep C screening- Yes HIV screening- Yes  Past Medical History:  Diagnosis Date  . Hyperlipidemia   . Migraine      Past Surgical History:  Procedure Laterality Date  . NO PAST SURGERIES      Medications  Current Outpatient Medications on File Prior to Visit  Medication Sig Dispense Refill  . fish oil-omega-3 fatty acids 1000 MG capsule Take 2 g by mouth daily.    . montelukast (SINGULAIR) 10 MG tablet Take 1 tab daily when your sinuses flare. 30 tablet 5    Allergies Allergies  Allergen Reactions  . Nickel Other (See Comments)    Patient states that costume jewelry will create discoloration.    Review of Systems: Constitutional:  no unexpected weight changes Eye:  no recent significant change in vision Ear/Nose/Mouth/Throat:  Ears:  no recent change in hearing Nose/Mouth/Throat:  no complaints of nasal congestion, no sore throat Cardiovascular: no chest pain Respiratory:  no shortness of breath Gastrointestinal:  no abdominal pain, no change in bowel habits GU:  Female: negative for dysuria or pelvic pain Musculoskeletal/Extremities:  no pain of the joints  Integumentary (Skin/Breast):  no abnormal skin lesions reported Neurologic:  no headaches Endocrine:  denies fatigue  Exam BP 110/68 (BP Location: Left Arm, Patient Position: Sitting, Cuff Size: Normal)   Pulse 89   Temp 98.3 F (36.8 C) (Oral)   Ht 5\' 7"  (1.702 m)   Wt 160 lb 6 oz (72.7 kg)   SpO2 98%   BMI 25.12 kg/m  General:  well developed, well nourished, in no apparent  distress Skin:  no significant moles, warts, or growths Head:  no masses, lesions, or tenderness Eyes:  pupils equal and round, sclera anicteric without injection Ears:  canals without lesions, TMs shiny without retraction, no obvious effusion, no erythema Nose:  nares patent, septum midline, mucosa normal, and no drainage or sinus tenderness Throat/Pharynx:  lips and gingiva without lesion; tongue and uvula midline; non-inflamed pharynx; no exudates or postnasal drainage Neck: neck supple without adenopathy, thyromegaly, or masses Lungs:  clear to auscultation, breath sounds equal bilaterally, no respiratory distress Cardio:  regular rate and rhythm, no LE edema Abdomen:  abdomen soft, nontender; bowel sounds normal; no masses or organomegaly Genital: Defer to GYN Musculoskeletal:  symmetrical muscle groups noted without atrophy or deformity Extremities:  no clubbing, cyanosis, or edema, no deformities, no skin discoloration Neuro:  gait normal; deep tendon reflexes normal and symmetric Psych: well oriented with normal range of affect and appropriate judgment/insight  Assessment and Plan  Well adult exam - Plan: CBC, Comprehensive metabolic panel, Lipid panel  Allergy, initial encounter - Plan: mometasone (NASONEX) 50 MCG/ACT nasal spray   Well 59 y.o. female. Counseled on diet and exercise. Other orders as above. Follow up in 1 yr or prn. The patient voiced understanding and agreement to the plan.  [address] [place], DO 03/30/20 8:56 AM

## 2020-03-31 ENCOUNTER — Other Ambulatory Visit: Payer: Self-pay | Admitting: Family Medicine

## 2020-03-31 DIAGNOSIS — R748 Abnormal levels of other serum enzymes: Secondary | ICD-10-CM

## 2020-03-31 DIAGNOSIS — E785 Hyperlipidemia, unspecified: Secondary | ICD-10-CM

## 2020-03-31 DIAGNOSIS — E1169 Type 2 diabetes mellitus with other specified complication: Secondary | ICD-10-CM

## 2020-03-31 LAB — COMPREHENSIVE METABOLIC PANEL
AG Ratio: 1.9 (calc) (ref 1.0–2.5)
ALT: 32 U/L — ABNORMAL HIGH (ref 6–29)
AST: 22 U/L (ref 10–35)
Albumin: 4.3 g/dL (ref 3.6–5.1)
Alkaline phosphatase (APISO): 58 U/L (ref 37–153)
BUN: 20 mg/dL (ref 7–25)
CO2: 30 mmol/L (ref 20–32)
Calcium: 9.7 mg/dL (ref 8.6–10.4)
Chloride: 105 mmol/L (ref 98–110)
Creat: 0.86 mg/dL (ref 0.50–1.05)
Globulin: 2.3 g/dL (calc) (ref 1.9–3.7)
Glucose, Bld: 88 mg/dL (ref 65–99)
Potassium: 4.6 mmol/L (ref 3.5–5.3)
Sodium: 142 mmol/L (ref 135–146)
Total Bilirubin: 0.5 mg/dL (ref 0.2–1.2)
Total Protein: 6.6 g/dL (ref 6.1–8.1)

## 2020-03-31 LAB — CBC
HCT: 42.5 % (ref 35.0–45.0)
Hemoglobin: 14.1 g/dL (ref 11.7–15.5)
MCH: 29.3 pg (ref 27.0–33.0)
MCHC: 33.2 g/dL (ref 32.0–36.0)
MCV: 88.4 fL (ref 80.0–100.0)
MPV: 10.6 fL (ref 7.5–12.5)
Platelets: 272 10*3/uL (ref 140–400)
RBC: 4.81 10*6/uL (ref 3.80–5.10)
RDW: 12.2 % (ref 11.0–15.0)
WBC: 4.1 10*3/uL (ref 3.8–10.8)

## 2020-03-31 LAB — LIPID PANEL
Cholesterol: 242 mg/dL — ABNORMAL HIGH (ref <200)
HDL: 59 mg/dL (ref >=50)
LDL Cholesterol (Calc): 159 mg/dL (calc) — ABNORMAL HIGH
Non-HDL Cholesterol (Calc): 183 mg/dL (calc) — ABNORMAL HIGH (ref <130)
Total CHOL/HDL Ratio: 4.1 (calc) (ref <5.0)
Triglycerides: 122 mg/dL (ref <150)

## 2020-04-01 ENCOUNTER — Other Ambulatory Visit: Payer: Self-pay

## 2020-04-01 ENCOUNTER — Ambulatory Visit
Admission: RE | Admit: 2020-04-01 | Discharge: 2020-04-01 | Disposition: A | Discharge status: Home / Self Care | Payer: BC Managed Care – PPO | Source: Ambulatory Visit | Attending: Obstetrics and Gynecology | Admitting: Obstetrics and Gynecology

## 2020-04-01 ENCOUNTER — Other Ambulatory Visit: Payer: Self-pay | Admitting: Obstetrics and Gynecology

## 2020-04-01 DIAGNOSIS — R928 Other abnormal and inconclusive findings on diagnostic imaging of breast: Secondary | ICD-10-CM

## 2020-04-01 DIAGNOSIS — N6489 Other specified disorders of breast: Secondary | ICD-10-CM

## 2020-05-12 ENCOUNTER — Other Ambulatory Visit: Payer: Self-pay

## 2020-05-12 ENCOUNTER — Other Ambulatory Visit (INDEPENDENT_AMBULATORY_CARE_PROVIDER_SITE_OTHER): Payer: BC Managed Care – PPO

## 2020-05-12 DIAGNOSIS — R748 Abnormal levels of other serum enzymes: Secondary | ICD-10-CM

## 2020-05-12 DIAGNOSIS — E785 Hyperlipidemia, unspecified: Secondary | ICD-10-CM

## 2020-05-12 LAB — HEPATIC FUNCTION PANEL
ALT: 29 U/L (ref 0–35)
AST: 20 U/L (ref 0–37)
Albumin: 4.5 g/dL (ref 3.5–5.2)
Alkaline Phosphatase: 56 U/L (ref 39–117)
Bilirubin, Direct: 0.1 mg/dL (ref 0.0–0.3)
Total Bilirubin: 0.7 mg/dL (ref 0.2–1.2)
Total Protein: 6.7 g/dL (ref 6.0–8.3)

## 2020-05-12 LAB — LIPID PANEL
Cholesterol: 241 mg/dL — ABNORMAL HIGH (ref 0–200)
HDL: 60.2 mg/dL (ref >39.00)
LDL Cholesterol: 158 mg/dL — ABNORMAL HIGH (ref 0–99)
NonHDL: 180.77
Total CHOL/HDL Ratio: 4
Triglycerides: 116 mg/dL (ref 0.0–149.0)
VLDL: 23.2 mg/dL (ref 0.0–40.0)

## 2020-05-19 ENCOUNTER — Other Ambulatory Visit: Payer: Self-pay | Admitting: Family Medicine

## 2020-05-19 DIAGNOSIS — J3489 Other specified disorders of nose and nasal sinuses: Secondary | ICD-10-CM

## 2020-05-20 ENCOUNTER — Other Ambulatory Visit: Payer: Self-pay | Admitting: Family Medicine

## 2020-06-01 ENCOUNTER — Other Ambulatory Visit: Payer: Self-pay | Admitting: Family Medicine

## 2020-07-05 ENCOUNTER — Other Ambulatory Visit: Payer: Self-pay | Admitting: Family Medicine

## 2020-07-05 DIAGNOSIS — J3489 Other specified disorders of nose and nasal sinuses: Secondary | ICD-10-CM

## 2020-10-01 ENCOUNTER — Other Ambulatory Visit: Payer: Self-pay

## 2020-10-01 ENCOUNTER — Other Ambulatory Visit: Payer: Self-pay | Admitting: Obstetrics and Gynecology

## 2020-10-01 ENCOUNTER — Ambulatory Visit
Admission: RE | Admit: 2020-10-01 | Discharge: 2020-10-01 | Disposition: A | Discharge status: Home / Self Care | Payer: BC Managed Care – PPO | Source: Ambulatory Visit | Attending: Obstetrics and Gynecology | Admitting: Obstetrics and Gynecology

## 2020-10-01 DIAGNOSIS — N6489 Other specified disorders of breast: Secondary | ICD-10-CM

## 2020-10-29 ENCOUNTER — Ambulatory Visit: Payer: BC Managed Care – PPO | Admitting: Medical

## 2020-10-29 ENCOUNTER — Other Ambulatory Visit: Payer: Self-pay

## 2020-10-29 VITALS — BP 133/69 | HR 85 | Temp 97.8°F | Resp 20 | Ht 67.0 in | Wt 156.0 lb

## 2020-10-29 DIAGNOSIS — G43809 Other migraine, not intractable, without status migrainosus: Secondary | ICD-10-CM

## 2020-10-29 DIAGNOSIS — S30861A Insect bite (nonvenomous) of abdominal wall, initial encounter: Secondary | ICD-10-CM

## 2020-10-29 DIAGNOSIS — Z836 Family history of other diseases of the respiratory system: Secondary | ICD-10-CM | POA: Diagnosis not present

## 2020-10-29 DIAGNOSIS — W57XXXA Bitten or stung by nonvenomous insect and other nonvenomous arthropods, initial encounter: Secondary | ICD-10-CM

## 2020-10-29 MED ORDER — DOXYCYCLINE HYCLATE 100 MG PO TABS: 100.0000 mg | ORAL_TABLET | Freq: Two times a day (BID) | ORAL | 0 refills | Status: DC

## 2020-10-29 MED ORDER — SUMATRIPTAN SUCCINATE 50 MG PO TABS: 50.0000 mg | ORAL_TABLET | ORAL | 0 refills | Status: DC | PRN

## 2020-10-29 MED ORDER — METHYLPREDNISOLONE 4 MG PO TABS: ORAL_TABLET | ORAL | 0 refills | Status: DC

## 2020-10-29 NOTE — Patient Instructions (Addendum)
For numerous tick bites I do think best to give doxycycline antibiotic. Rx advisement given.   Under clinical scenario tick antibody studies not required. Early on antibody studies would most likely be negative.  If signs and symptoms worsen, change or persist let us know.  Discussed  prednisone request for migraine ha and you have history of allergies. Pressure during air travel may impact conditions/worsen. .  Discussed with Dr. Carmelia Roller and  he ok'd  medrol dose pack use if needed for congestion. Also  offer you imitrex  ofr obvious migraine headache.  Follow up as regularly scheduled with pcp or as needed.

## 2020-10-29 NOTE — Progress Notes (Signed)
Subjective:    Patient ID: Candice Bush, female    DOB: 24-May-1961, 60 y.o.   MRN: [id number]  HPI Pt in for recent tick bites. She states various tick bite. One on her upper left breast. Some rt upper breast, 4 on upper abdomen and 2 latisimus dorsi.  All bite over last 3-4 days. Pt has pulled off all the ticks.   Pt works in her yard all a lot.  Pt will travel to. Pt is afraid she is going to migraine after air travel. Pt states pcp was going to give prednisone.   Review of Systems  Constitutional: Negative for chills, fatigue and fever.  Respiratory: Negative for cough, choking, shortness of breath and wheezing.   Cardiovascular: Negative for chest pain and palpitations.  Gastrointestinal: Negative for abdominal pain.  Genitourinary: Negative for dysuria.  Musculoskeletal: Negative for myalgias.  Skin: Negative for rash.       Tick bites as described hpi.  Neurological: Negative for dizziness, syncope, weakness, numbness and headaches.  Hematological: Negative for adenopathy. Does not bruise/bleed easily.  Psychiatric/Behavioral: Negative for behavioral problems, confusion and sleep disturbance. The patient is not nervous/anxious.     Past Medical History:  Diagnosis Date  . Hyperlipidemia   . Migraine      Social History   Socioeconomic History  . Marital status: Married    Spouse name: Not on file  . Number of children: Not on file  . Years of education: Not on file  . Highest education level: Not on file  Occupational History  . Occupation: Runner, broadcasting/film/video    Comment: Science writer  Tobacco Use  . Smoking status: Never Smoker  . Smokeless tobacco: Never Used  Substance and Sexual Activity  . Alcohol use: Yes    Comment: occasionally  . Drug use: No  . Sexual activity: Not Currently  Other Topics Concern  . Not on file  Social History Narrative   Lives with husband, who is a Curator with [employer].   Social Determinants of Health    Financial Resource Strain: Not on file  Food Insecurity: Not on file  Transportation Needs: Not on file  Physical Activity: Not on file  Stress: Not on file  Social Connections: Not on file  Intimate Partner Violence: Not on file    Past Surgical History:  Procedure Laterality Date  . NO PAST SURGERIES      Family History  Problem Relation Age of Onset  . Hypertension Mother   . Hyperlipidemia Mother   . Alzheimer's disease Mother   . Cancer Mother        colon cancer  . Hypertension Sister   . Breast cancer Paternal Aunt     Allergies  Allergen Reactions  . Nickel Other (See Comments)    Patient states that costume jewelry will create discoloration.    Current Outpatient Medications on File Prior to Visit  Medication Sig Dispense Refill  . fish oil-omega-3 fatty acids 1000 MG capsule Take 2 g by mouth daily.    . mometasone (NASONEX) 50 MCG/ACT nasal spray Place 2 sprays into the nose daily. 17 g 12  . montelukast (SINGULAIR) 10 MG tablet Take 1 tablet (10 mg total) by mouth at bedtime. 90 tablet 3  . valACYclovir (VALTREX) 500 MG tablet TAKE 4 TABLETS BY MOUTH AND REPEAT DOSE IN 12 HOURS FOR FEVER BLISTER 30 tablet 1   No current facility-administered medications on file prior to visit.    BP 133/69  Pulse 85   Temp 97.8 F (36.6 C) (Oral)   Resp 20   Ht 5\' 7"  (1.702 m)   Wt 156 lb (70.8 kg)   SpO2 99%   BMI 24.43 kg/m       Objective:   Physical Exam  General- No acute distress. Pleasant patient. Neck- Full range of motion, no jvd Lungs- Clear, even and unlabored. Heart- regular rate and rhythm. Neurologic- CNII- XII grossly intact. Skin- small red area about 2 mm diameter on abdomen where tick bites were present.      Assessment & Plan:  For numerous tick bites I do think best to give doxycycline antibiotic. Rx advisement given.   Under clinical scenario tick antibody studies not required. Early on antibody studies would most likely be  negative.  If signs and symptoms worsen, change or persist let know.  Discussed  prednisone request for migraine ha and you have history of allergies. Pressure during air travel may impact conditions/worsen. .  Discussed with Dr. [place] and  he ok'd  medrol dose pack use if needed for congestion. Also  offer you imitrex  ofr obvious migraine headache.  Follow up as regularly scheduled with pcp or as needed.  Carmelia Roller, PA-C

## 2020-11-01 ENCOUNTER — Ambulatory Visit: Payer: BC Managed Care – PPO | Admitting: Family Medicine

## 2021-04-01 ENCOUNTER — Encounter: Payer: BC Managed Care – PPO | Admitting: Family Medicine

## 2021-04-05 ENCOUNTER — Other Ambulatory Visit: Payer: Self-pay

## 2021-04-05 ENCOUNTER — Ambulatory Visit
Admission: RE | Admit: 2021-04-05 | Discharge: 2021-04-05 | Disposition: A | Discharge status: Home / Self Care | Payer: BC Managed Care – PPO | Source: Ambulatory Visit | Attending: Obstetrics and Gynecology | Admitting: Obstetrics and Gynecology

## 2021-04-05 DIAGNOSIS — N6489 Other specified disorders of breast: Secondary | ICD-10-CM

## 2021-05-03 ENCOUNTER — Other Ambulatory Visit: Payer: Self-pay

## 2021-05-04 ENCOUNTER — Other Ambulatory Visit: Payer: Self-pay | Admitting: Family Medicine

## 2021-05-04 ENCOUNTER — Encounter: Payer: Self-pay | Admitting: Family Medicine

## 2021-05-04 ENCOUNTER — Ambulatory Visit (INDEPENDENT_AMBULATORY_CARE_PROVIDER_SITE_OTHER): Payer: BC Managed Care – PPO | Admitting: Family Medicine

## 2021-05-04 VITALS — BP 118/62 | HR 90 | Temp 98.1°F | Ht 67.0 in | Wt 157.5 lb

## 2021-05-04 DIAGNOSIS — Z0000 Encounter for general adult medical examination without abnormal findings: Secondary | ICD-10-CM

## 2021-05-04 DIAGNOSIS — T7840XA Allergy, unspecified, initial encounter: Secondary | ICD-10-CM

## 2021-05-04 DIAGNOSIS — R2689 Other abnormalities of gait and mobility: Secondary | ICD-10-CM | POA: Diagnosis not present

## 2021-05-04 DIAGNOSIS — F411 Generalized anxiety disorder: Secondary | ICD-10-CM

## 2021-05-04 LAB — COMPREHENSIVE METABOLIC PANEL
ALT: 24 U/L (ref 0–35)
AST: 19 U/L (ref 0–37)
Albumin: 4.5 g/dL (ref 3.5–5.2)
Alkaline Phosphatase: 56 U/L (ref 39–117)
BUN: 15 mg/dL (ref 6–23)
CO2: 33 mEq/L — ABNORMAL HIGH (ref 19–32)
Calcium: 9.8 mg/dL (ref 8.4–10.5)
Chloride: 104 mEq/L (ref 96–112)
Creatinine, Ser: 0.86 mg/dL (ref 0.40–1.20)
GFR: 74.52 mL/min (ref >60.00)
Glucose, Bld: 90 mg/dL (ref 70–99)
Potassium: 4.8 mEq/L (ref 3.5–5.1)
Sodium: 142 mEq/L (ref 135–145)
Total Bilirubin: 0.6 mg/dL (ref 0.2–1.2)
Total Protein: 6.7 g/dL (ref 6.0–8.3)

## 2021-05-04 LAB — LIPID PANEL
Cholesterol: 233 mg/dL — ABNORMAL HIGH (ref 0–200)
HDL: 54.7 mg/dL (ref >39.00)
LDL Cholesterol: 152 mg/dL — ABNORMAL HIGH (ref 0–99)
NonHDL: 178.24
Total CHOL/HDL Ratio: 4
Triglycerides: 129 mg/dL (ref 0.0–149.0)
VLDL: 25.8 mg/dL (ref 0.0–40.0)

## 2021-05-04 LAB — CBC
HCT: 41.2 % (ref 36.0–46.0)
Hemoglobin: 13.6 g/dL (ref 12.0–15.0)
MCHC: 33.1 g/dL (ref 30.0–36.0)
MCV: 87.9 fl (ref 78.0–100.0)
Platelets: 248 10*3/uL (ref 150.0–400.0)
RBC: 4.69 Mil/uL (ref 3.87–5.11)
RDW: 13.1 % (ref 11.5–15.5)
WBC: 4 10*3/uL (ref 4.0–10.5)

## 2021-05-04 LAB — TSH: TSH: 0.48 u[IU]/mL (ref 0.35–5.50)

## 2021-05-04 MED ORDER — CITALOPRAM HYDROBROMIDE 10 MG PO TABS: 10.0000 mg | ORAL_TABLET | Freq: Every day | ORAL | 3 refills | Status: DC

## 2021-05-04 MED ORDER — MOMETASONE FUROATE 50 MCG/ACT NA SUSP: 2.0000 | Freq: Every day | NASAL | 12 refills | Status: DC

## 2021-05-04 NOTE — Progress Notes (Signed)
Chief Complaint  Patient presents with   Follow-up     Well Woman Candice Bush is here for a complete physical.   Her last physical was >1 year ago.  Current diet: in general, a "healthy" diet. Current exercise: walking, upper body wt resistance. Weight is stable and she denies fatigue out of ordinary. Seatbelt? Yes  Health Maintenance Pap/HPV- Scheduled next week Mammogram- Yes Colon cancer screening-Yes Shingrix- Yes Tetanus- Yes Hep C screening- Yes HIV screening- Yes  GAD- over past 6 mo has had worsening anxiety without cause. Racing thoughts, difficulty sleeping. Not following w a counselor. No SI or HI. No self medication. No depressive symptoms.  Balance: Will veer to both sides but usually to the R over the past 10 years or so. Since retiring in 2020, she has been walking more. No weakness, vision changes, difficulty swallowing or trouble with speech.   Past Medical History:  Diagnosis Date   Hyperlipidemia    Migraine      Past Surgical History:  Procedure Laterality Date   NO PAST SURGERIES      Medications  Current Outpatient Medications on File Prior to Visit  Medication Sig Dispense Refill   fish oil-omega-3 fatty acids 1000 MG capsule Take 2 g by mouth daily.     mometasone (NASONEX) 50 MCG/ACT nasal spray Place 2 sprays into the nose daily. 17 g 12   montelukast (SINGULAIR) 10 MG tablet Take 1 tablet (10 mg total) by mouth at bedtime. 90 tablet 3   SUMAtriptan (IMITREX) 50 MG tablet Take 1 tablet (50 mg total) by mouth every 2 (two) hours as needed for migraine. May repeat in 2 hours if headache persists or recurs. 10 tablet 0   valACYclovir (VALTREX) 500 MG tablet TAKE 4 TABLETS BY MOUTH AND REPEAT DOSE IN 12 HOURS FOR FEVER BLISTER 30 tablet 1   Allergies Allergies  Allergen Reactions   Nickel Other (See Comments)    Patient states that costume jewelry will create discoloration.    Review of Systems: Constitutional:  no unexpected weight  changes Eye:  no recent significant change in vision Ear/Nose/Mouth/Throat:  Ears:  no recent change in hearing Nose/Mouth/Throat:  no complaints of nasal congestion, no sore throat Cardiovascular: no chest pain Respiratory:  no shortness of breath Gastrointestinal:  no abdominal pain, no change in bowel habits GU:  Female: negative for dysuria or pelvic pain Musculoskeletal/Extremities:  no pain of the joints Integumentary (Skin/Breast):  no abnormal skin lesions reported Neurologic:  +balance as above Endocrine:  denies fatigue  Exam BP 118/62   Pulse 90   Temp 98.1 F (36.7 C) (Oral)   Ht 5\' 7"  (1.702 m)   Wt 157 lb 8 oz (71.4 kg)   LMP 04/13/2012   SpO2 97%   BMI 24.67 kg/m  General:  well developed, well nourished, in no apparent distress Skin:  no significant moles, warts, or growths Head:  no masses, lesions, or tenderness Eyes:  pupils equal and round, sclera anicteric without injection Ears:  canals without lesions, TMs shiny without retraction, no obvious effusion, no erythema Nose:  nares patent, septum midline, mucosa normal, and no drainage or sinus tenderness Throat/Pharynx:  lips and gingiva without lesion; tongue and uvula midline; non-inflamed pharynx; no exudates or postnasal drainage Neck: neck supple without adenopathy, thyromegaly, or masses Lungs:  clear to auscultation, breath sounds equal bilaterally, no respiratory distress Cardio:  regular rate and rhythm, no LE edema Abdomen:  abdomen soft, nontender; bowel sounds normal;  no masses or organomegaly Genital: Defer to GYN Musculoskeletal:  symmetrical muscle groups noted without atrophy or deformity Extremities:  no clubbing, cyanosis, or edema, no deformities, no skin discoloration Neuro:  gait normal; deep tendon reflexes normal and symmetric Psych: well oriented with normal range of affect and appropriate judgment/insight  Assessment and Plan  Well adult exam - Plan: CBC, Comprehensive metabolic  panel, Lipid panel, TSH   Well 60 y.o. female. Counseled on diet and exercise. Other orders as above. Follow up in 1 yr or prn. The patient voiced understanding and agreement to the plan.  Jilda Roche [place], DO 05/04/21 8:37 AM

## 2021-05-04 NOTE — Patient Instructions (Addendum)
Give [place] 2-3 business days to get the results of your labs back.   Keep the diet clean and stay active.  I recommend getting the updated bivalent covid vaccination booster at your convenience.   If you do not hear anything about your referral in the next 1-2 weeks, call our office and ask for an update.  Please consider counseling. Contact [phone] to schedule an appointment or inquire about cost/insurance coverage.  [facility] located at [address], [place], [place].  Phone number = [phone].  Dr. Regan Lemming - Adult Psychiatry.    [facility] located at [address] [place], [place], [place]. Phone number = [phone].   [facility] located at [address], [place], [place].  Phone number = [phone].   [facility] located at [address] [place], [place], [place].  Phone number = [phone].  Let us know if you need anything.

## 2021-05-10 LAB — HM PAP SMEAR: HM Pap smear: NORMAL

## 2021-06-15 ENCOUNTER — Ambulatory Visit: Payer: BC Managed Care – PPO | Admitting: Family Medicine

## 2021-07-08 LAB — HM DIABETES EYE EXAM

## 2021-07-11 ENCOUNTER — Encounter: Payer: Self-pay | Admitting: Family Medicine

## 2021-07-15 ENCOUNTER — Encounter: Payer: Self-pay | Admitting: Family Medicine

## 2021-07-15 ENCOUNTER — Ambulatory Visit: Payer: BC Managed Care – PPO | Admitting: Family Medicine

## 2021-07-15 ENCOUNTER — Other Ambulatory Visit: Payer: Self-pay | Admitting: Family Medicine

## 2021-07-15 VITALS — BP 108/72 | HR 81 | Temp 98.0°F | Resp 16 | Ht 67.0 in | Wt 161.6 lb

## 2021-07-15 DIAGNOSIS — E7800 Pure hypercholesterolemia, unspecified: Secondary | ICD-10-CM

## 2021-07-15 DIAGNOSIS — R109 Unspecified abdominal pain: Secondary | ICD-10-CM | POA: Diagnosis not present

## 2021-07-15 LAB — COMPREHENSIVE METABOLIC PANEL
ALT: 27 U/L (ref 0–35)
AST: 20 U/L (ref 0–37)
Albumin: 4.2 g/dL (ref 3.5–5.2)
Alkaline Phosphatase: 57 U/L (ref 39–117)
BUN: 14 mg/dL (ref 6–23)
CO2: 31 mEq/L (ref 19–32)
Calcium: 9.6 mg/dL (ref 8.4–10.5)
Chloride: 102 mEq/L (ref 96–112)
Creatinine, Ser: 0.82 mg/dL (ref 0.40–1.20)
GFR: 78.8 mL/min (ref >60.00)
Glucose, Bld: 86 mg/dL (ref 70–99)
Potassium: 4.5 mEq/L (ref 3.5–5.1)
Sodium: 140 mEq/L (ref 135–145)
Total Bilirubin: 0.7 mg/dL (ref 0.2–1.2)
Total Protein: 6.6 g/dL (ref 6.0–8.3)

## 2021-07-15 LAB — LIPID PANEL
Cholesterol: 236 mg/dL — ABNORMAL HIGH (ref 0–200)
HDL: 51.4 mg/dL (ref >39.00)
LDL Cholesterol: 149 mg/dL — ABNORMAL HIGH (ref 0–99)
NonHDL: 184.52
Total CHOL/HDL Ratio: 5
Triglycerides: 176 mg/dL — ABNORMAL HIGH (ref 0.0–149.0)
VLDL: 35.2 mg/dL (ref 0.0–40.0)

## 2021-07-15 LAB — CBC
HCT: 41 % (ref 36.0–46.0)
Hemoglobin: 13.7 g/dL (ref 12.0–15.0)
MCHC: 33.4 g/dL (ref 30.0–36.0)
MCV: 87.6 fl (ref 78.0–100.0)
Platelets: 269 10*3/uL (ref 150.0–400.0)
RBC: 4.68 Mil/uL (ref 3.87–5.11)
RDW: 12.9 % (ref 11.5–15.5)
WBC: 4.1 10*3/uL (ref 4.0–10.5)

## 2021-07-15 MED ORDER — PANTOPRAZOLE SODIUM 40 MG PO TBEC: 40.0000 mg | DELAYED_RELEASE_TABLET | Freq: Every day | ORAL | 1 refills | Status: DC

## 2021-07-15 NOTE — Patient Instructions (Addendum)
Give [place] 2-3 business days to get the results of your labs back.   Keep an eye on your food triggers.  If you continue to get better, do not take the Protonix.  Consider Bean-O for flatulence.   Let us know if you need anything.

## 2021-07-15 NOTE — Progress Notes (Signed)
Chief Complaint  Patient presents with   Nausea   GI Problem    Subjective: Patient is a 61 y.o. female here for nausea.  1 week ago, started having nausea w/o vomiting, lots of gas, bloating, gurgling, pain (described as soreness) on R side and around belly button. Tried Pepcid, Gas-X, an expired Zofran, Scope patch. Shared sub w spouse prior to illness, but husband fine. Denies fevers, bleeding, diarrhea, nighttime awakenings. Seems to get worse when she lies down and eats.   Past Medical History:  Diagnosis Date   Hyperlipidemia    Migraine     Objective: BP 108/72    Pulse 81    Temp 98 F (36.7 C)    Resp 16    Ht 5\' 7"  (1.702 m)    Wt 161 lb 9.6 oz (73.3 kg)    LMP 04/13/2012    SpO2 98%    BMI 25.31 kg/m  General: Awake, appears stated age Heart: RRR, no LE edema Lungs: CTAB, no rales, wheezes or rhonchi. No accessory muscle use Abd: BS+, S, NT, ND; neg Murphy's, McBurney's, Carnett's, Rovsing's HEENT: MMM Psych: Age appropriate judgment and insight, normal affect and mood  Assessment and Plan: Abdominal pain, unspecified abdominal location - Plan: CBC, pantoprazole (PROTONIX) 40 MG tablet  Pure hypercholesterolemia - Plan: Lipid panel, Comprehensive metabolic panel  New problem, uncertain prog. Differential includes GERD, food intolerance, viral infection. Doubt ulcer, appendicitis. PPI trial if she doesn't improve in next few days.  Check labs. Follow-up on elevated cholesterol, she has fasted today. The patient voiced understanding and agreement to the plan.  06/13/2012 [place], DO 07/15/21  9:19 AM

## 2021-07-19 ENCOUNTER — Other Ambulatory Visit: Payer: BC Managed Care – PPO

## 2021-08-01 ENCOUNTER — Other Ambulatory Visit: Payer: Self-pay | Admitting: Family Medicine

## 2021-08-03 ENCOUNTER — Ambulatory Visit (HOSPITAL_COMMUNITY): Payer: BC Managed Care – PPO

## 2021-10-03 ENCOUNTER — Ambulatory Visit (HOSPITAL_COMMUNITY): Payer: BC Managed Care – PPO

## 2021-10-04 ENCOUNTER — Other Ambulatory Visit: Payer: Self-pay | Admitting: Family Medicine

## 2021-10-04 MED ORDER — VALACYCLOVIR HCL 500 MG PO TABS: ORAL_TABLET | ORAL | 1 refills | Status: DC

## 2021-10-16 ENCOUNTER — Encounter (HOSPITAL_COMMUNITY): Payer: Self-pay

## 2021-12-28 ENCOUNTER — Encounter: Payer: Self-pay | Admitting: Family Medicine

## 2021-12-28 MED ORDER — CITALOPRAM HYDROBROMIDE 10 MG PO TABS: ORAL_TABLET | ORAL | 3 refills | Status: DC

## 2022-01-25 ENCOUNTER — Ambulatory Visit (HOSPITAL_COMMUNITY)
Admission: RE | Admit: 2022-01-25 | Discharge: 2022-01-25 | Disposition: A | Discharge status: Home / Self Care | Payer: BC Managed Care – PPO | Source: Ambulatory Visit | Attending: Family Medicine | Admitting: Family Medicine

## 2022-01-25 DIAGNOSIS — I2510 Atherosclerotic heart disease of native coronary artery without angina pectoris: Secondary | ICD-10-CM | POA: Insufficient documentation

## 2022-02-20 IMAGING — MG DIGITAL DIAGNOSTIC BILAT W/ TOMO W/ CAD
8 series · 8 of 24 positions shown · non-contrast
Comparison: Previous exam(s).

CLINICAL DATA: 60-year-old female presenting for 1 year follow-up
of a probably benign left breast asymmetry and for annual exam of
the right breast.

EXAM:
DIGITAL DIAGNOSTIC BILATERAL MAMMOGRAM WITH TOMOSYNTHESIS AND CAD
TECHNIQUE: Bilateral digital diagnostic mammography and breast tomosynthesis
was performed. The images were evaluated with computer-aided
detection.

[R CC synth-2D]
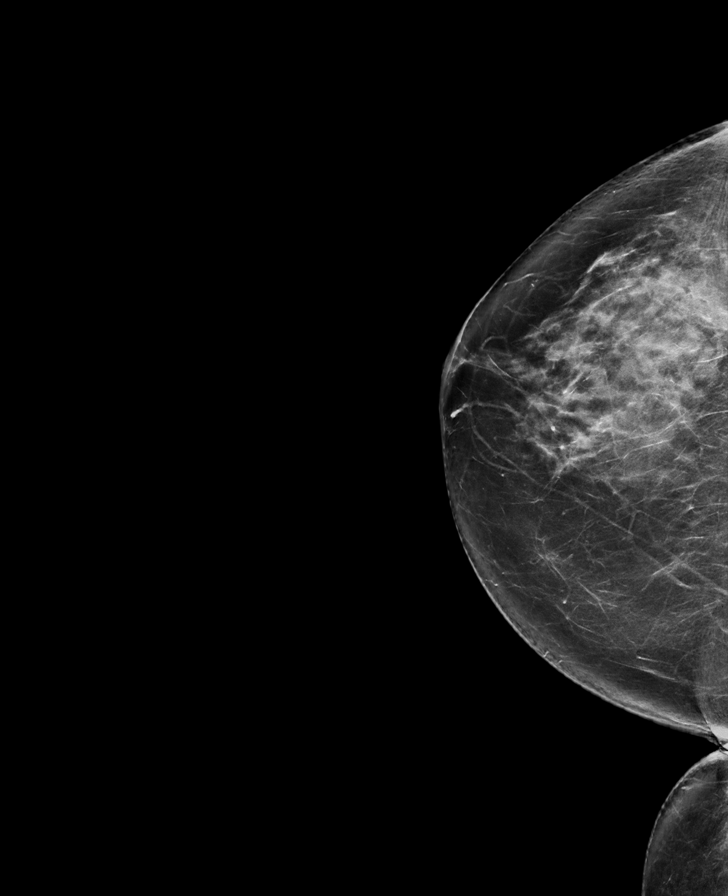

[R MLO synth-2D]
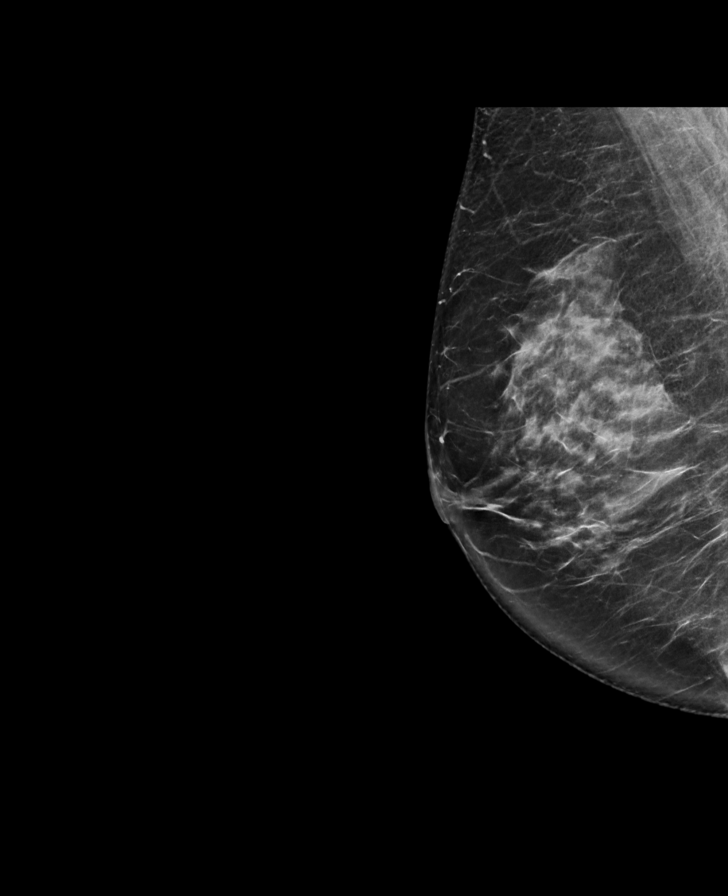

[L MLO synth-2D]
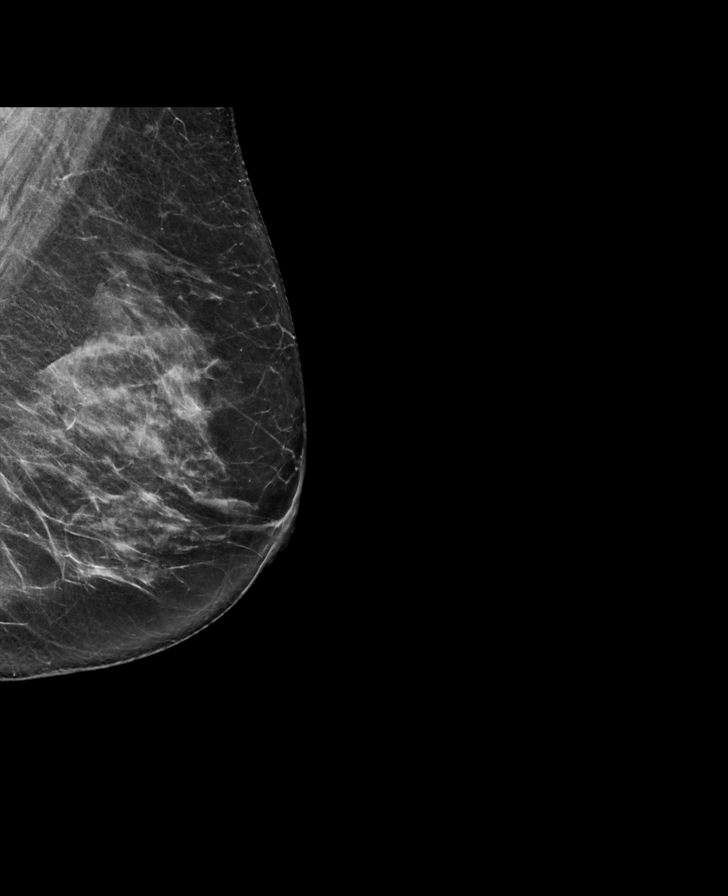

[L CC synth-2D]
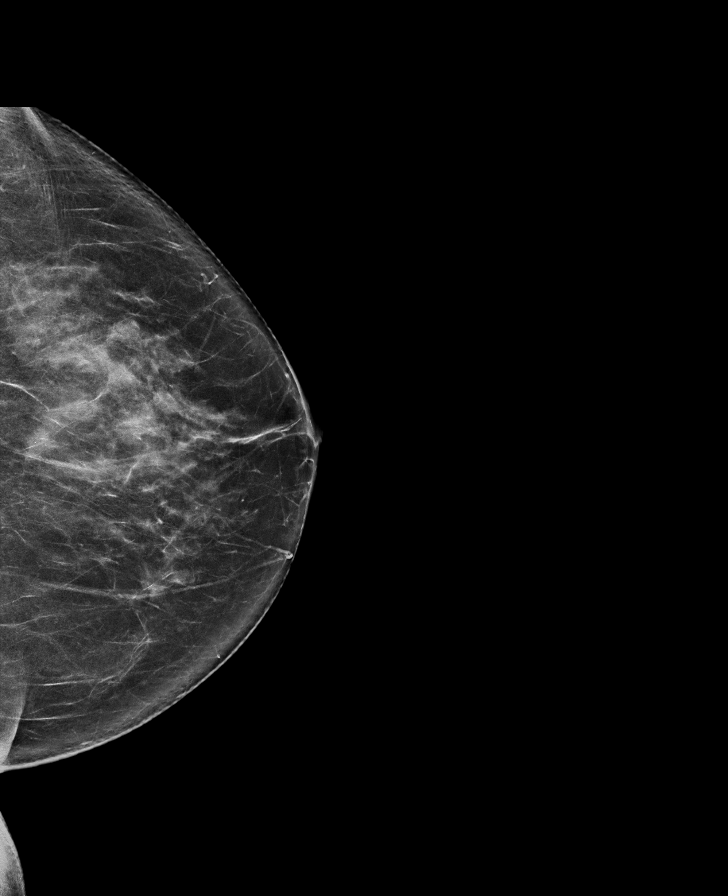

[R MLO tomo · tomo slice 39/77.0]
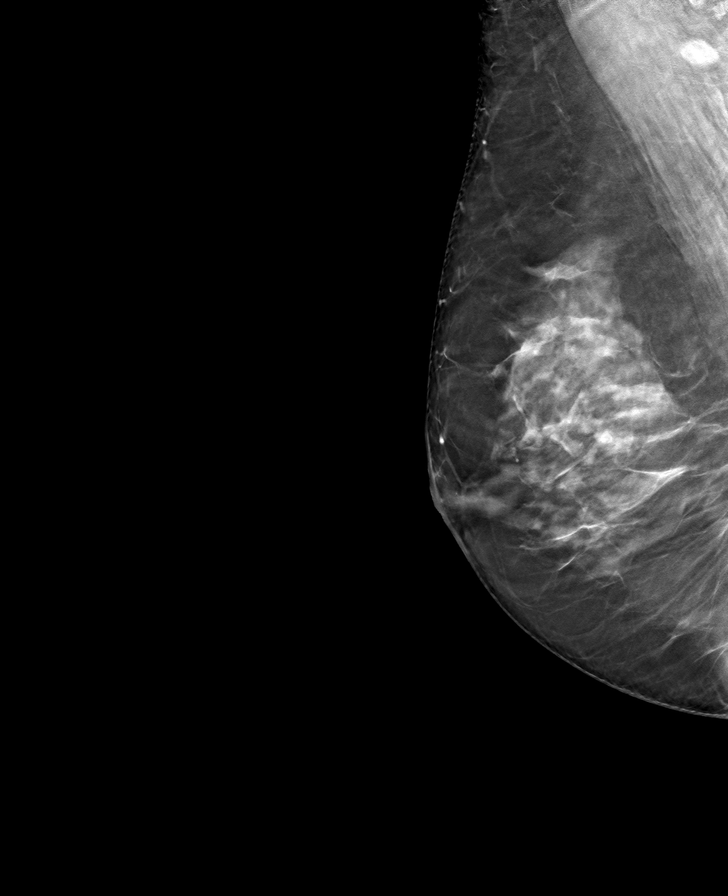

[L MLO tomo · tomo slice 37/74.0]
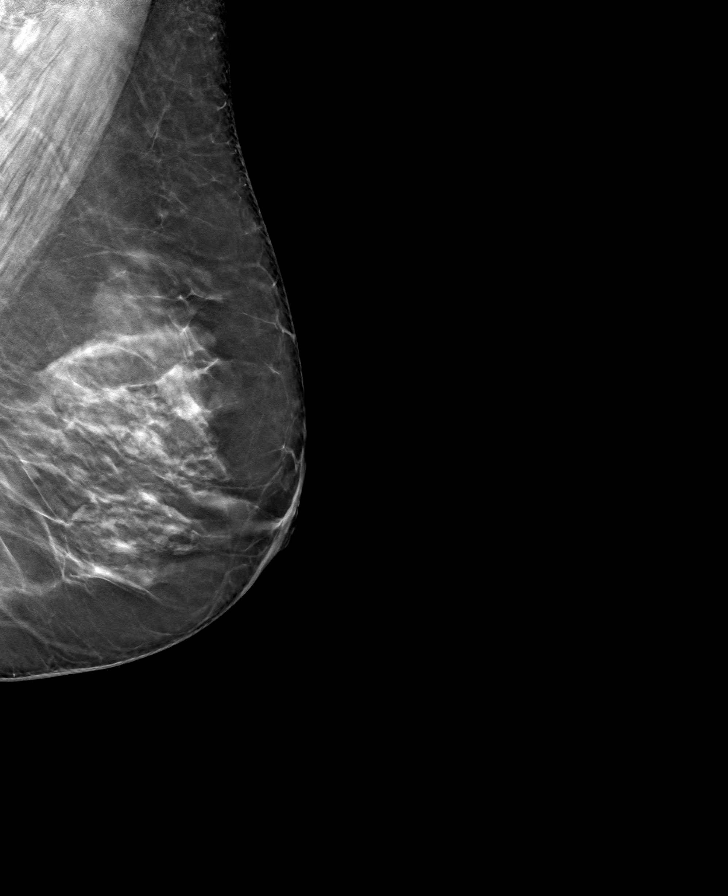

[R CC tomo · tomo slice 39/77.0]
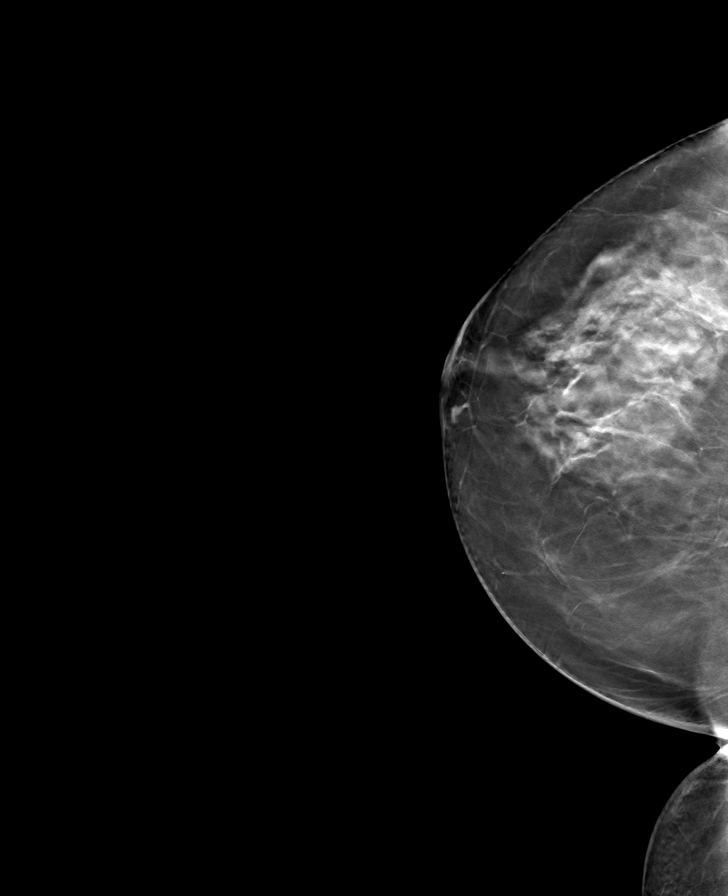

[L CC tomo · tomo slice 36/71.0]
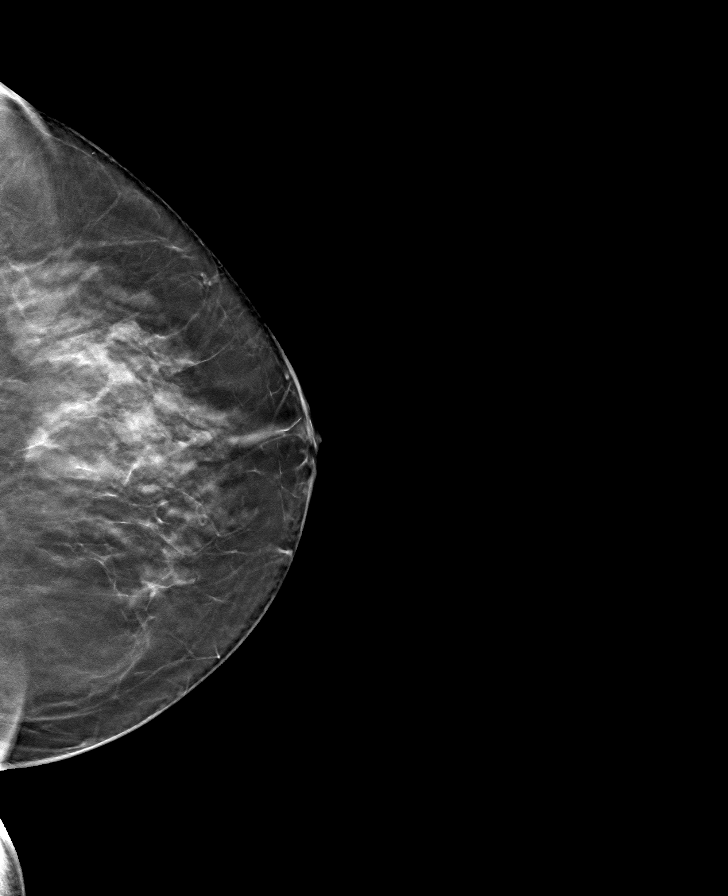

[8 of 24 positions shown; findings below may reference images not displayed]

ACR Breast Density Category c: The breast tissue is heterogeneously
dense, which may obscure small masses.
FINDINGS: Right breast: No suspicious mass, distortion, or microcalcifications
are identified to suggest presence of malignancy.

Left breast: Stable to slight decrease in conspicuity of a small
asymmetry in the medial left breast. There are no new suspicious
findings elsewhere in the left breast.
IMPRESSION: 1.  Stable probably benign asymmetry in the medial left breast.

2.  No mammographic evidence of malignancy in the right breast.

RECOMMENDATION:
Diagnostic bilateral mammogram in 1 year.

I have discussed the findings and recommendations with the patient.
If applicable, a reminder letter will be sent to the patient
regarding the next appointment.

BI-RADS CATEGORY  3: Probably benign.

## 2022-02-22 ENCOUNTER — Other Ambulatory Visit: Payer: Self-pay | Admitting: Obstetrics and Gynecology

## 2022-02-22 DIAGNOSIS — Z1231 Encounter for screening mammogram for malignant neoplasm of breast: Secondary | ICD-10-CM

## 2022-04-06 ENCOUNTER — Ambulatory Visit
Admission: RE | Admit: 2022-04-06 | Discharge: 2022-04-06 | Disposition: A | Discharge status: Home / Self Care | Payer: BC Managed Care – PPO | Source: Ambulatory Visit | Attending: Obstetrics and Gynecology | Admitting: Obstetrics and Gynecology

## 2022-04-06 DIAGNOSIS — Z1231 Encounter for screening mammogram for malignant neoplasm of breast: Secondary | ICD-10-CM

## 2022-04-07 ENCOUNTER — Other Ambulatory Visit: Payer: Self-pay | Admitting: Obstetrics and Gynecology

## 2022-04-07 DIAGNOSIS — N63 Unspecified lump in unspecified breast: Secondary | ICD-10-CM

## 2022-04-15 ENCOUNTER — Ambulatory Visit
Admission: RE | Admit: 2022-04-15 | Discharge: 2022-04-15 | Disposition: A | Discharge status: Home / Self Care | Payer: BC Managed Care – PPO | Source: Ambulatory Visit | Attending: Obstetrics and Gynecology | Admitting: Obstetrics and Gynecology

## 2022-04-15 DIAGNOSIS — N63 Unspecified lump in unspecified breast: Secondary | ICD-10-CM

## 2022-04-23 ENCOUNTER — Other Ambulatory Visit: Payer: Self-pay | Admitting: Family Medicine

## 2022-04-25 ENCOUNTER — Encounter: Payer: Self-pay | Admitting: Family Medicine

## 2022-04-26 ENCOUNTER — Encounter: Payer: Self-pay | Admitting: Family Medicine

## 2022-06-01 ENCOUNTER — Other Ambulatory Visit: Payer: Self-pay | Admitting: Family Medicine

## 2022-06-01 DIAGNOSIS — J3489 Other specified disorders of nose and nasal sinuses: Secondary | ICD-10-CM

## 2022-06-01 DIAGNOSIS — T7840XA Allergy, unspecified, initial encounter: Secondary | ICD-10-CM

## 2022-07-28 ENCOUNTER — Encounter: Payer: Self-pay | Admitting: Family Medicine

## 2022-07-28 ENCOUNTER — Ambulatory Visit
Admission: EM | Admit: 2022-07-28 | Discharge: 2022-07-28 | Disposition: A | Discharge status: Home / Self Care | Payer: BC Managed Care – PPO | Attending: Internal Medicine | Admitting: Internal Medicine

## 2022-07-28 DIAGNOSIS — N3001 Acute cystitis with hematuria: Secondary | ICD-10-CM

## 2022-07-28 LAB — POCT URINALYSIS DIP (MANUAL ENTRY)
Bilirubin, UA: NEGATIVE
Glucose, UA: NEGATIVE mg/dL
Nitrite, UA: POSITIVE — AB
Protein Ur, POC: NEGATIVE mg/dL
Spec Grav, UA: 1.02 (ref 1.010–1.025)
Urobilinogen, UA: 0.2 E.U./dL
pH, UA: 5 (ref 5.0–8.0)

## 2022-07-28 MED ORDER — PHENAZOPYRIDINE HCL 200 MG PO TABS: 200.0000 mg | ORAL_TABLET | Freq: Three times a day (TID) | ORAL | 0 refills | Status: DC

## 2022-07-28 MED ORDER — CEPHALEXIN 500 MG PO CAPS: 500.0000 mg | ORAL_CAPSULE | Freq: Two times a day (BID) | ORAL | 0 refills | Status: AC

## 2022-07-28 NOTE — ED Provider Notes (Signed)
[facility]    CSN: [id number] Arrival date & time: 07/28/22  1423      History   Chief Complaint Chief Complaint  Patient presents with   Urinary Frequency    HPI Candice Bush is a 62 y.o. female who presents for evaluation of dysuria for 1 day. Patient reports associated symptoms of urinary burning, urgency, and frequency. Patient reports a history of UTI's several years ago but denies history of pyelonephritis. Patient denies fever, chills, nausea, vomiting, abdominal pain, flank pain, low back pain. No vaginal discharge or STD concern. Patient has taken nothing for symptoms. No other c/o today.    Urinary Frequency    Past Medical History:  Diagnosis Date   Hyperlipidemia    Migraine     Patient Active Problem List   Diagnosis Date Noted   Balance problem 05/04/2021   GAD (generalized anxiety disorder) 02/14/2018   Pain of right thumb 02/14/2018   Migraine     Past Surgical History:  Procedure Laterality Date   NO PAST SURGERIES      OB History   No obstetric history on file.      Home Medications    Prior to Admission medications   Medication Sig Start Date End Date Taking? Authorizing Provider  cephALEXin (KEFLEX) 500 MG capsule Take 1 capsule (500 mg total) by mouth 2 (two) times daily for 7 days. 07/28/22 08/04/22 Yes Melynda Ripple, NP  phenazopyridine (PYRIDIUM) 200 MG tablet Take 1 tablet (200 mg total) by mouth 3 (three) times daily. 07/28/22  Yes Melynda Ripple, NP  citalopram (CELEXA) 10 MG tablet TAKE 1 TABLET(10 MG) BY MOUTH DAILY 04/24/22   Wendling, Crosby Oyster, DO  fish oil-omega-3 fatty acids 1000 MG capsule Take 2 g by mouth daily.    [provider]  mometasone (NASONEX) 50 MCG/ACT nasal spray SHAKE LIQUID AND USE 2 SPRAYS IN [facility] NOSTRIL DAILY 06/01/22   Wendling, Crosby Oyster, DO  montelukast (SINGULAIR) 10 MG tablet TAKE 1 TABLET(10 MG) BY MOUTH AT BEDTIME 06/01/22   Wendling, Crosby Oyster, DO  pantoprazole  (PROTONIX) 40 MG tablet Take 1 tablet (40 mg total) by mouth daily. 07/15/21   Shelda Pal, DO  SUMAtriptan (IMITREX) 50 MG tablet Take 1 tablet (50 mg total) by mouth every 2 (two) hours as needed for migraine. May repeat in 2 hours if headache persists or recurs. 10/29/20   Saguier, Percell Miller, PA-C  valACYclovir (VALTREX) 500 MG tablet Take 4 tablets by mouth and repeat dose in 12 hours for fever blister. 10/04/21   Shelda Pal, DO    Family History Family History  Problem Relation Age of Onset   Hypertension Mother    Hyperlipidemia Mother    Alzheimer's disease Mother    Cancer Mother        colon cancer   Hypertension Sister    Breast cancer Paternal Aunt    Breast cancer Paternal Aunt    Breast cancer Paternal Aunt     Social History Social History   Tobacco Use   Smoking status: Never   Smokeless tobacco: Never  Substance Use Topics   Alcohol use: Yes    Comment: occasionally   Drug use: No     Allergies   Nickel   Review of Systems Review of Systems  Genitourinary:  Positive for dysuria and frequency.     Physical Exam Triage Vital Signs ED Triage Vitals  Enc Vitals Group     BP 07/28/22  1455 119/78     Pulse Rate 07/28/22 1455 (!) 104     Resp 07/28/22 1455 16     Temp 07/28/22 1455 97.7 F (36.5 C)     Temp Source 07/28/22 1455 Oral     SpO2 07/28/22 1455 98 %     Weight --      Height --      Head Circumference --      Peak Flow --      Pain Score 07/28/22 1501 4     Pain Loc --      Pain Edu? --      Excl. in [place]? --    No data found.  Updated Vital Signs BP 119/78 (BP Location: Right Arm)   Pulse (!) 104   Temp 97.7 F (36.5 C) (Oral)   Resp 16   LMP 04/13/2012   SpO2 98%   Visual Acuity Right Eye Distance:   Left Eye Distance:   Bilateral Distance:    Right Eye Near:   Left Eye Near:    Bilateral Near:     Physical Exam Vitals and nursing note reviewed.  Constitutional:      Appearance: Normal  appearance.  HENT:     Head: Normocephalic and atraumatic.  Eyes:     Pupils: Pupils are equal, round, and reactive to light.  Cardiovascular:     Rate and Rhythm: Normal rate.  Pulmonary:     Effort: Pulmonary effort is normal.  Abdominal:     Tenderness: There is no right CVA tenderness or left CVA tenderness.  Skin:    General: Skin is warm and dry.  Neurological:     General: No focal deficit present.     Mental Status: She is alert and oriented to person, place, and time.  Psychiatric:        Mood and Affect: Mood normal.        Behavior: Behavior normal.      UC Treatments / Results  Labs (all labs ordered are listed, but only abnormal results are displayed) Labs Reviewed  POCT URINALYSIS DIP (MANUAL ENTRY) - Abnormal; Notable for the following components:      Result Value   Ketones, POC UA trace (5) (*)    Blood, UA moderate (*)    Nitrite, UA Positive (*)    Leukocytes, UA Small (1+) (*)    All other components within normal limits  URINE CULTURE    EKG   Radiology No results found.  Procedures Procedures (including critical care time)  Medications Ordered in UC Medications - No data to display  Initial Impression / Assessment and Plan / UC Course  I have reviewed the triage vital signs and the nursing notes.  Pertinent labs & imaging results that were available during my care of the patient were reviewed by me and considered in my medical decision making (see chart for details).     Reviewed exam and symptoms with patient. Start Keflex and will send urine culture Pyridium as needed Rest and fluids PCP follow-up 2 to 3 days for recheck ER precautions reviewed and patient verbalized understanding Final Clinical Impressions(s) / UC Diagnoses   Final diagnoses:  Acute cystitis with hematuria     Discharge Instructions      Keflex twice daily for 7 days Pyridium as needed.  Please note this medication make your urine orange Rest and  fluids The clinical contact you with results of your urine culture indicate a need for change  in your treatment Follow-up with your PCP 2 to 3 days for recheck Please go to the emergency room if you have any worsening symptoms   ED Prescriptions     Medication Sig Dispense Auth. Provider   cephALEXin (KEFLEX) 500 MG capsule Take 1 capsule (500 mg total) by mouth 2 (two) times daily for 7 days. 14 capsule Radford Pax, NP   phenazopyridine (PYRIDIUM) 200 MG tablet Take 1 tablet (200 mg total) by mouth 3 (three) times daily. 6 tablet Radford Pax, NP      PDMP not reviewed this encounter.   Radford Pax, NP 07/28/22 [phone]

## 2022-07-28 NOTE — Telephone Encounter (Signed)
Patient called back and was advised that she needed an appointment but we did not have any in this office but did in another office. She stated she understood and did not request an appointment.

## 2022-07-28 NOTE — Discharge Instructions (Signed)
Keflex twice daily for 7 days Pyridium as needed.  Please note this medication make your urine orange Rest and fluids The clinical contact you with results of your urine culture indicate a need for change in your treatment Follow-up with your PCP 2 to 3 days for recheck Please go to the emergency room if you have any worsening symptoms

## 2022-07-28 NOTE — ED Triage Notes (Signed)
Pt c/o urinary frequency that began this morning.   Home interventions: none

## 2022-07-31 LAB — URINE CULTURE: Culture: >=100000 — AB

## 2022-08-21 ENCOUNTER — Other Ambulatory Visit: Payer: Self-pay | Admitting: Family Medicine

## 2022-08-21 NOTE — Telephone Encounter (Signed)
The patient returned my call and scheduled an appointment on 09/01/22 Sent in refill for her

## 2022-08-21 NOTE — Telephone Encounter (Signed)
Called left message to call back 

## 2022-08-21 NOTE — Telephone Encounter (Signed)
Last OV---07/15/2021 Last RF---04/24/22--#30 with 3 refills Called the patient left detailed message to call back to schedule an appointment.

## 2022-09-01 ENCOUNTER — Ambulatory Visit (INDEPENDENT_AMBULATORY_CARE_PROVIDER_SITE_OTHER): Payer: BC Managed Care – PPO | Admitting: Family Medicine

## 2022-09-01 ENCOUNTER — Encounter: Payer: Self-pay | Admitting: Family Medicine

## 2022-09-01 VITALS — BP 132/85 | HR 80 | Temp 97.9°F | Ht 66.5 in | Wt 165.5 lb

## 2022-09-01 DIAGNOSIS — Z0000 Encounter for general adult medical examination without abnormal findings: Secondary | ICD-10-CM | POA: Diagnosis not present

## 2022-09-01 LAB — CBC
HCT: 43.4 % (ref 36.0–46.0)
Hemoglobin: 14.5 g/dL (ref 12.0–15.0)
MCHC: 33.4 g/dL (ref 30.0–36.0)
MCV: 87.7 fl (ref 78.0–100.0)
Platelets: 278 10*3/uL (ref 150.0–400.0)
RBC: 4.95 Mil/uL (ref 3.87–5.11)
RDW: 13.1 % (ref 11.5–15.5)
WBC: 4.2 10*3/uL (ref 4.0–10.5)

## 2022-09-01 LAB — COMPREHENSIVE METABOLIC PANEL
ALT: 17 U/L (ref 0–35)
AST: 17 U/L (ref 0–37)
Albumin: 4.2 g/dL (ref 3.5–5.2)
Alkaline Phosphatase: 52 U/L (ref 39–117)
BUN: 14 mg/dL (ref 6–23)
CO2: 30 mEq/L (ref 19–32)
Calcium: 10 mg/dL (ref 8.4–10.5)
Chloride: 104 mEq/L (ref 96–112)
Creatinine, Ser: 0.89 mg/dL (ref 0.40–1.20)
GFR: 70.86 mL/min (ref >60.00)
Glucose, Bld: 80 mg/dL (ref 70–99)
Potassium: 4.5 mEq/L (ref 3.5–5.1)
Sodium: 140 mEq/L (ref 135–145)
Total Bilirubin: 0.5 mg/dL (ref 0.2–1.2)
Total Protein: 6.7 g/dL (ref 6.0–8.3)

## 2022-09-01 LAB — LIPID PANEL
Cholesterol: 231 mg/dL — ABNORMAL HIGH (ref 0–200)
HDL: 56.9 mg/dL (ref >39.00)
LDL Cholesterol: 147 mg/dL — ABNORMAL HIGH (ref 0–99)
NonHDL: 173.63
Total CHOL/HDL Ratio: 4
Triglycerides: 131 mg/dL (ref 0.0–149.0)
VLDL: 26.2 mg/dL (ref 0.0–40.0)

## 2022-09-01 MED ORDER — VALACYCLOVIR HCL 500 MG PO TABS: ORAL_TABLET | ORAL | 1 refills | Status: DC

## 2022-09-01 NOTE — Progress Notes (Signed)
Chief Complaint  Patient presents with   Annual Exam     Well Woman Candice Bush is here for a complete physical.   Her last physical was >1 year ago.  Current diet: in general, starting to eat healthy again. Current exercise: walking, aerobics. Post-menopausal. Fatigue out of ordinary? No Seatbelt? Yes Advanced directive? No  Health Maintenance Pap/HPV- Yes Tetanus- Yes CCS- Yes HIV screening- Yes Hep C screening- Yes  Past Medical History:  Diagnosis Date   Hyperlipidemia    Migraine      Past Surgical History:  Procedure Laterality Date   NO PAST SURGERIES      Medications  Current Outpatient Medications on File Prior to Visit  Medication Sig Dispense Refill   citalopram (CELEXA) 10 MG tablet TAKE 1 TABLET(10 MG) BY MOUTH DAILY 30 tablet 3   fish oil-omega-3 fatty acids 1000 MG capsule Take 2 g by mouth daily.     mometasone (NASONEX) 50 MCG/ACT nasal spray SHAKE LIQUID AND USE 2 SPRAYS IN EACH NOSTRIL DAILY 17 g 12   pantoprazole (PROTONIX) 40 MG tablet Take 1 tablet (40 mg total) by mouth daily. 30 tablet 1   valACYclovir (VALTREX) 500 MG tablet Take 4 tablets by mouth and repeat dose in 12 hours for fever blister. 30 tablet 1   Allergies Allergies  Allergen Reactions   Nickel Other (See Comments)    Patient states that costume jewelry will create discoloration.    Review of Systems: Constitutional:  no unexpected weight changes Eye:  no recent significant change in vision Ear/Nose/Mouth/Throat:  Ears:  no tinnitus or vertigo and no recent change in hearing Nose/Mouth/Throat:  no complaints of nasal congestion, no sore throat Cardiovascular: no chest pain Respiratory:  no new cough (many years of cough in Am and PM, worked up reportedly and neg) and no shortness of breath Gastrointestinal:  no abdominal pain, no change in bowel habits GU:  Female: negative for dysuria or pelvic pain Musculoskeletal/Extremities:  no new pain of the joints; knees  will give out once every few weeks. Integumentary (Skin/Breast):  no abnormal skin lesions reported Neurologic:  no headaches Endocrine:  denies fatigue Hematologic/Lymphatic:  No areas of easy bleeding  Exam BP 132/85 (BP Location: Left Arm, Patient Position: Sitting, Cuff Size: Normal)   Pulse 80   Temp 97.9 F (36.6 C) (Oral)   Ht 5' 6.5" (1.689 m)   Wt 165 lb 8 oz (75.1 kg)   LMP 04/13/2012   SpO2 99%   BMI 26.31 kg/m  General:  well developed, well nourished, in no apparent distress Skin:  no significant moles, warts, or growths Head:  no masses, lesions, or tenderness Eyes:  pupils equal and round, sclera anicteric without injection Ears:  canals without lesions, TMs shiny without retraction, no obvious effusion, no erythema Nose:  nares patent, mucosa normal, and no drainage  Throat/Pharynx:  lips and gingiva without lesion; tongue and uvula midline; non-inflamed pharynx; no exudates or postnasal drainage Neck: neck supple without adenopathy, thyromegaly, or masses Lungs:  clear to auscultation, breath sounds equal bilaterally, no respiratory distress Cardio:  regular rate and rhythm, no bruits, no LE edema Abdomen:  abdomen soft, nontender; bowel sounds normal; no masses or organomegaly Genital: Defer to GYN Musculoskeletal:  symmetrical muscle groups noted without atrophy or deformity Extremities:  no clubbing, cyanosis, or edema, no deformities, no skin discoloration Neuro:  gait normal; deep tendon reflexes normal and symmetric Psych: well oriented with normal range of affect and appropriate  judgment/insight  Assessment and Plan  Well adult exam - Plan: CBC, Comprehensive metabolic panel, Lipid panel   Well 62 y.o. female. Counseled on diet and exercise. Advanced directive form provided today.  Other orders as above. Offered to refer to pulm for cough, it has not changed since her workup so she politely declined. She will let me know if she changes her mind.   Stretches/exercises provided to strengthen areas around her knees. PT if no better.  Follow up in 6 mo. The patient voiced understanding and agreement to the plan.  [place], DO 09/01/22 9:12 AM

## 2022-09-01 NOTE — Patient Instructions (Addendum)
Give [place] 2-3 business days to get the results of your labs back.   Keep the diet clean and stay active.  Please get me a copy of your advanced directive form at your convenience.   Consider adding a probiotic to your diet. You could consider [employer] or [employer] as needed for gas.   Let us know if you need anything.  Knee Exercises It is normal to feel mild stretching, pulling, tightness, or discomfort as you do these exercises, but you should stop right away if you feel sudden pain or your pain gets worse.  STRETCHING AND RANGE OF MOTION EXERCISES  These exercises warm up your muscles and joints and improve the movement and flexibility of your knee. These exercises also help to relieve pain, numbness, and tingling. Exercise A: Knee Extension, Prone  Lie on your abdomen on a bed. Place your left / right knee just beyond the edge of the surface so your knee is not on the bed. You can put a towel under your left / right thigh just above your knee for comfort. Relax your leg muscles and allow gravity to straighten your knee. You should feel a stretch behind your left / right knee. Hold this position for 30 seconds. Scoot up so your knee is supported between repetitions. Repeat 2 times. Complete this stretch 3 times per week. Exercise B: Knee Flexion, Active     Lie on your back with both knees straight. If this causes back discomfort, bend your left / right knee so your foot is flat on the floor. Slowly slide your left / right heel back toward your buttocks until you feel a gentle stretch in the front of your knee or thigh. Hold this position for 30 seconds. Slowly slide your left / right heel back to the starting position. Repeat 2 times. Complete this exercise 3 times per week. Exercise C: Quadriceps, Prone     Lie on your abdomen on a firm surface, such as a bed or padded floor. Bend your left / right knee and hold your ankle. If you cannot reach your ankle or pant leg, loop a belt around  your foot and grab the belt instead. Gently pull your heel toward your buttocks. Your knee should not slide out to the side. You should feel a stretch in the front of your thigh and knee. Hold this position for 30 seconds. Repeat 2 times. Complete this stretch 3 times per week. Exercise D: Hamstring, Supine  Lie on your back. Loop a belt or towel over the ball of your left / right foot. The ball of your foot is on the walking surface, right under your toes. Straighten your left / right knee and slowly pull on the belt to raise your leg until you feel a gentle stretch behind your knee. Do not let your left / right knee bend while you do this. Keep your other leg flat on the floor. Hold this position for 30 seconds. Repeat 2 times. Complete this stretch 3 times per week. STRENGTHENING EXERCISES  These exercises build strength and endurance in your knee. Endurance is the ability to use your muscles for a long time, even after they get tired. Exercise E: Quadriceps, Isometric     Lie on your back with your left / right leg extended and your other knee bent. Put a rolled towel or small pillow under your knee if told by your health care provider. Slowly tense the muscles in the front of your left /  right thigh. You should see your kneecap slide up toward your hip or see increased dimpling just above the knee. This motion will push the back of the knee toward the floor. For 3 seconds, keep the muscle as tight as you can without increasing your pain. Relax the muscles slowly and completely. Repeat for 10 total reps Repeat 2 ti mes. Complete this exercise 3 times per week. Exercise F: Straight Leg Raises - Quadriceps  Lie on your back with your left / right leg extended and your other knee bent. Tense the muscles in the front of your left / right thigh. You should see your kneecap slide up or see increased dimpling just above the knee. Your thigh may even shake a bit. Keep these muscles tight as you  raise your leg 4-6 inches (10-15 cm) off the floor. Do not let your knee bend. Hold this position for 3 seconds. Keep these muscles tense as you lower your leg. Relax your muscles slowly and completely after each repetition. 10 total reps. Repeat 2 times. Complete this exercise 3 times per week.  Exercise G: Hamstring Curls     If told by your health care provider, do this exercise while wearing ankle weights. Begin with 5 lb weights (optional). Then increase the weight by 1 lb (0.5 kg) increments. Do not wear ankle weights that are more than 20 lbs to start with. Lie on your abdomen with your legs straight. Bend your left / right knee as far as you can without feeling pain. Keep your hips flat against the floor. Hold this position for 3 seconds. Slowly lower your leg to the starting position. Repeat for 10 reps.  Repeat 2 times. Complete this exercise 3 times per week. Exercise H: Squats (Quadriceps)  Stand in front of a table, with your feet and knees pointing straight ahead. You may rest your hands on the table for balance but not for support. Slowly bend your knees and lower your hips like you are going to sit in a chair. Keep your weight over your heels, not over your toes. Keep your lower legs upright so they are parallel with the table legs. Do not let your hips go lower than your knees. Do not bend lower than told by your health care provider. If your knee pain increases, do not bend as low. Hold the squat position for 1 second. Slowly push with your legs to return to standing. Do not use your hands to pull yourself to standing. Repeat 2 times. Complete this exercise 3 times per week. Exercise I: Wall Slides (Quadriceps)     Lean your back against a smooth wall or door while you walk your feet out 18-24 inches (46-61 cm) from it. Place your feet hip-width apart. Slowly slide down the wall or door until your knees Repeat 2 times. Complete this exercise every other day. Exercise  K: Straight Leg Raises - Hip Abductors  Lie on your side with your left / right leg in the top position. Lie so your head, shoulder, knee, and hip line up. You may bend your bottom knee to help you keep your balance. Roll your hips slightly forward so your hips are stacked directly over each other and your left / right knee is facing forward. Leading with your heel, lift your top leg 4-6 inches (10-15 cm). You should feel the muscles in your outer hip lifting. Do not let your foot drift forward. Do not let your knee roll toward the ceiling. Hold  this position for 3 seconds. Slowly return your leg to the starting position. Let your muscles relax completely after each repetition. 10 total reps. Repeat 2 times. Complete this exercise 3 times per week. Exercise J: Straight Leg Raises - Hip Extensors  Lie on your abdomen on a firm surface. You can put a pillow under your hips if that is more comfortable. Tense the muscles in your buttocks and lift your left / right leg about 4-6 inches (10-15 cm). Keep your knee straight as you lift your leg. Hold this position for 3 seconds. Slowly lower your leg to the starting position. Let your leg relax completely after each repetition. Repeat 2 times. Complete this exercise 3 times per week. Document Released: 05/03/2005 Document Revised: 03/13/2016 Document Reviewed: 04/25/2015 Elsevier Interactive Patient Education  2017 [employer].

## 2022-11-22 ENCOUNTER — Ambulatory Visit: Payer: BC Managed Care – PPO | Admitting: Family Medicine

## 2022-11-22 ENCOUNTER — Encounter: Payer: Self-pay | Admitting: Family Medicine

## 2022-11-22 VITALS — BP 128/82 | HR 86 | Temp 98.0°F | Wt 165.0 lb

## 2022-11-22 DIAGNOSIS — M25532 Pain in left wrist: Secondary | ICD-10-CM

## 2022-11-22 DIAGNOSIS — S2096XA Insect bite (nonvenomous) of unspecified parts of thorax, initial encounter: Secondary | ICD-10-CM

## 2022-11-22 DIAGNOSIS — M25531 Pain in right wrist: Secondary | ICD-10-CM | POA: Diagnosis not present

## 2022-11-22 DIAGNOSIS — H6992 Unspecified Eustachian tube disorder, left ear: Secondary | ICD-10-CM | POA: Diagnosis not present

## 2022-11-22 DIAGNOSIS — W57XXXA Bitten or stung by nonvenomous insect and other nonvenomous arthropods, initial encounter: Secondary | ICD-10-CM

## 2022-11-22 MED ORDER — FLUCONAZOLE 150 MG PO TABS: ORAL_TABLET | ORAL | 0 refills | Status: DC

## 2022-11-22 MED ORDER — DOXYCYCLINE HYCLATE 100 MG PO TABS: 100.0000 mg | ORAL_TABLET | Freq: Two times a day (BID) | ORAL | 0 refills | Status: AC

## 2022-11-22 MED ORDER — PREDNISONE 20 MG PO TABS: 40.0000 mg | ORAL_TABLET | Freq: Every day | ORAL | 0 refills | Status: AC

## 2022-11-22 NOTE — Progress Notes (Signed)
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 female here for a skin complaint.  Duration: 1 week; bit by a tick Location: R side Pruritic? No Painful? No Drainage? No New soaps/lotions/topicals/detergents? No Sick contacts? No Other associated symptoms: red ring around area, no fevers Therapies tried thus far: none  Headache- 3 weeks without trauma. Associated nasal congestion and L ear pain. Does not feel like her typical migraine. No neuro s/s's. Seems to be getting worse. Started on L side but is spreading to the R side of her head. She has associated neck pain. She has not been using her allergy medication.   Bilateral wrist/thumb pain. R side started 10 years ago and had an injection that helped. L started 3 weeks ago. No injr or change in activity. Wearing a splint on L side. No bruising, redness, swelling, neuro s/s's. Interested in seeing a specialist.   Past Medical History:  Diagnosis Date   Hyperlipidemia    Migraine     BP 128/82 (BP Location: Left Arm, Patient Position: Sitting, Cuff Size: Normal)   Pulse 86   Temp 98 F (36.7 C) (Oral)   Wt 165 lb (74.8 kg)   LMP 04/13/2012   SpO2 97%   BMI 26.23 kg/m  Gen: awake, alert, appearing stated age HEENT: Canals patent without otorrhea, TMs negative bilaterally, nares are patent, clear rhinorrhea noted in the left, no sinus TTP bilaterally, MMM, no pharyngeal exudate or erythema, PERRLA, EOMI MSK: No TTP over the suboccipital triangle, cervical midline, cervical paraspinal musculature, TMJ, temporalis, or occiput Wrists: No deformity, edema, ecchymosis, or erythema.  Normal active and passive range of motion.  TTP over the extensor tendons of the thumb bilaterally, worse on the left.  Positive Finkelstein's bilaterally. Neuro: DTRs equal and symmetric throughout, no clonus, no cerebellar signs,  gait is normal Heart: RRR Lungs: Clear to auscultation bilaterally.  No accessory muscle use Skin: See below. No drainage, induration, TTP, fluctuance, excoriation, excessive warmth Psych: Age appropriate judgment and insight   R side  Bilateral wrist pain - Plan: Ambulatory referral to Sports Medicine  Tick bite of thoracic wall, unspecified whether front or back, initial encounter - Plan: doxycycline (VIBRA-TABS) 100 MG tablet  Dysfunction of left eustachian tube - Plan: predniSONE (DELTASONE) 20 MG tablet  Thumb spica splint recommended.  Stretches and exercises, heat, ice, Tylenol.  Refer to sports medicine. There is some central clearing between the areas of erythema making me question a target lesion.  Given this, will send in 21 days of doxycycline to cover for this. Warning signs and symptoms verbalized and written down in AVS.  Could be eustachian tube dysfunction is causing her headache.  Does not seem like migraines per the patient.  Continue oral antihistamines and Flonase as she had stopped this and pollen flares her allergy symptoms.  For acute treatment we will send in a 5-day prednisone burst 40 mg daily. F/u as originally scheduled or as needed. The patient voiced understanding and agreement to the plan.  I spent 35 minutes with the patient discussing the above plans in addition to reviewing her chart on the same day of the visit.  Jilda Roche Clyde, DO 11/22/22 12:14 PM

## 2022-11-22 NOTE — Patient Instructions (Addendum)
If you do not hear anything about your referral in the next 1-2 weeks, call our office and ask for an update.  Heat (pad or rice pillow in microwave) over affected area, 10-15 minutes twice daily.   Ice/cold pack over area for 10-15 min twice daily.  OK to take Tylenol 1000 mg (2 extra strength tabs) or 975 mg (3 regular strength tabs) every 6 hours as needed.  Let us know if you need anything.  Wrist and Forearm Exercises Do exercises exactly as told by your health care provider and adjust them as directed. It is normal to feel mild stretching, pulling, tightness, or discomfort as you do these exercises, but you should stop right away if you feel sudden pain or your pain gets worse.   RANGE OF MOTION EXERCISES These exercises warm up your muscles and joints and improve the movement and flexibility of your injured wrist and forearm. These exercises also help to relieve pain, numbness, and tingling. These exercises are done using the muscles in your injured wrist and forearm. Exercise A: Wrist Flexion, Active With your fingers relaxed, bend your wrist forward as far as you can. Hold this position for 30 seconds. Repeat 2 times. Complete this exercise 3 times per week. Exercise B: Wrist Extension, Active With your fingers relaxed, bend your wrist backward as far as you can. Hold this position for 30 seconds. Repeat 2 times. Complete this exercise 3 times per week. Exercise C: Supination, Active  Stand or sit with your arms at your sides. Bend your left / right elbow to an "L" shape (90 degrees). Turn your palm upward until you feel a gentle stretch on the inside of your forearm. Hold this position for 30 seconds. Slowly return your palm to the starting position. Repeat 2 times. Complete this exercise 3 times per week. Exercise D: Pronation, Active  Stand or sit with your arms at your sides. Bend your left / right elbow to an "L" shape (90 degrees). Turn your palm downward until you  feel a gentle stretch on the top of your forearm. Hold this position for 30 seconds. Slowly return your palm to the starting position. Repeat 2 times. Complete this exercise once a day.  STRETCHING EXERCISES These exercises warm up your muscles and joints and improve the movement and flexibility of your injured wrist and forearm. These exercises also help to relieve pain, numbness, and tingling. These exercises are done using your healthy wrist and forearm to help stretch the muscles in your injured wrist and forearm. Exercise E: Wrist Flexion, Passive  Extend your left / right arm in front of you, relax your wrist, and point your fingers downward. Gently push on the back of your hand. Stop when you feel a gentle stretch on the top of your forearm. Hold this position for 30 seconds. Repeat 2 times. Complete this exercise 3 times per week. Exercise F: Wrist Extension, Passive  Extend your left / right arm in front of you and turn your palm upward. Gently pull your palm and fingertips back so your fingers point downward. You should feel a gentle stretch on the palm-side of your forearm. Hold this position for 30 seconds. Repeat 2 times. Complete this exercise 3 times per week. Exercise G: Forearm Rotation, Supination, Passive Sit with your left / right elbow bent to an "L" shape (90 degrees) with your forearm resting on a table. Keeping your upper body and shoulder still, use your other hand to rotate your forearm palm-up until  you feel a gentle to moderate stretch. Hold this position for 30 seconds. Slowly release the stretch and return to the starting position. Repeat 2 times. Complete this exercise 3 times per week. Exercise H: Forearm Rotation, Pronation, Passive Sit with your left / right elbow bent to an "L" shape (90 degrees) with your forearm resting on a table. Keeping your upper body and shoulder still, use your other hand to rotate your forearm palm-down until you feel a gentle to  moderate stretch. Hold this position for 30 seconds. Slowly release the stretch and return to the starting position. Repeat 2 times. Complete this exercise 3 times per week.  STRENGTHENING EXERCISES These exercises build strength and endurance in your wrist and forearm. Endurance is the ability to use your muscles for a long time, even after they get tired. Exercise I: Wrist Flexors  Sit with your left / right forearm supported on a table and your hand resting palm-up over the edge of the table. Your elbow should be bent to an "L" shape (about 90 degrees) and be below the level of your shoulder. Hold a 3-5 lb weight in your left / right hand. Or, hold a rubber exercise band or tube in both hands, keeping your hands at the same level and hip distance apart. There should be a slight tension in the exercise band or tube. Slowly curl your hand up toward your forearm. Hold this position for 3 seconds. Slowly lower your hand back to the starting position. Repeat 2 times. Complete this exercise 3 times per week. Exercise J: Wrist Extensors  Sit with your left / right forearm supported on a table and your hand resting palm-down over the edge of the table. Your elbow should be bent to an "L" shape (about 90 degrees) and be below the level of your shoulder. Hold a 3-5 lb weight in your left / right hand. Or, hold a rubber exercise band or tube in both hands, keeping your hands at the same level and hip distance apart. There should be a slight tension in the exercise band or tube. Slowly curl your hand up toward your forearm. Hold this position for 3 seconds. Slowly lower your hand back to the starting position. Repeat 2 times. Complete this exercise 3 times per week. Exercise K: Forearm Rotation, Supination  Sit with your left / right forearm supported on a table and your hand resting palm-down. Your elbow should be at your side, bent to an "L" shape (about 90 degrees), and below the level of your  shoulder. Keep your wrist stable and in a neutral position throughout the exercise. Gently hold a lightweight hammer with your left / right hand. Without moving your elbow or wrist, slowly rotate your palm upward to a thumbs-up position. Hold this position for 3 seconds. Slowly return your forearm to the starting position. Repeat 2 times. Complete this exercise 3 times per week. Exercise L: Forearm Rotation, Pronation  Sit with your left / right forearm supported on a table and your hand resting palm-up. Your elbow should be at your side, bent to an "L" shape (about 90 degrees), and below the level of your shoulder. Keep your wrist stable. Do not allow it to move backward or forward during the exercise. Gently hold a lightweight hammer with your left / right hand. Without moving your elbow or wrist, slowly rotate your palm and hand upward to a thumbs-up position. Hold this position for 3 seconds. Slowly return your forearm to the starting  position. Repeat 2 times. Complete this exercise 3 times per week. Exercise M: Grip Strengthening  Hold one of these items in your left / right hand: play dough, therapy putty, a dense sponge, a stress ball, or a large, rolled sock. Squeeze as hard as you can without increasing pain. Hold this position for 5 seconds. Slowly release your grip. Repeat 2 times. Complete this exercise 3 times per week.  This information is not intended to replace advice given to you by your health care provider. Make sure you discuss any questions you have with your health care provider. Document Released: 05/03/2005 Document Revised: 03/13/2016 Document Reviewed: 03/14/2015 Elsevier Interactive Patient Education  [employer].

## 2022-11-23 ENCOUNTER — Other Ambulatory Visit: Payer: Self-pay | Admitting: Family Medicine

## 2022-11-28 ENCOUNTER — Ambulatory Visit (INDEPENDENT_AMBULATORY_CARE_PROVIDER_SITE_OTHER): Payer: BC Managed Care – PPO

## 2022-11-28 ENCOUNTER — Ambulatory Visit: Payer: BC Managed Care – PPO | Admitting: Family Medicine

## 2022-11-28 ENCOUNTER — Ambulatory Visit: Payer: Self-pay

## 2022-11-28 VITALS — BP 144/90 | HR 81 | Ht 66.5 in | Wt 166.0 lb

## 2022-11-28 DIAGNOSIS — M79644 Pain in right finger(s): Secondary | ICD-10-CM

## 2022-11-28 DIAGNOSIS — M79642 Pain in left hand: Secondary | ICD-10-CM

## 2022-11-28 DIAGNOSIS — M79641 Pain in right hand: Secondary | ICD-10-CM | POA: Diagnosis not present

## 2022-11-28 DIAGNOSIS — M79645 Pain in left finger(s): Secondary | ICD-10-CM

## 2022-11-28 NOTE — Patient Instructions (Addendum)
Thank you for coming in today.   Please get an Xray today before you leave   I've referred you to Hand Therapy.  Let us know if you don't hear from them in one week.   Please use Voltaren gel (Generic Diclofenac Gel) up to 4x daily for pain as needed.  This is available over-the-counter as both the name brand Voltaren gel and the generic diclofenac gel.   Check back in 6 weeks

## 2022-11-28 NOTE — Progress Notes (Signed)
Rubin Payor, PhD, LAT, ATC acting as a scribe for Candice Graham, MD.  Candice Bush is a 62 y.o. female who presents to [facility] at [facility] today for bilat thumb pain; R-side started 10+ years ago, L-side about 4 weeks ago. She is R-hand dominate. Pt locates pain to the MCP of both thumbs. She has modified things at home; changing cups, not using heavy cast iron pans.   Radiates: no Aggravates: lifting heavy objects, squeezing bottles Treatments tried: splint, modified ADLs  Pertinent review of systems: No fevers or chills  Relevant historical information: GAD.   Exam:  BP (!) 144/90   Pulse 81   Ht 5' 6.5" (1.689 m)   Wt 166 lb (75.3 kg)   LMP 04/13/2012   SpO2 97%   BMI 26.39 kg/m  General: Well Developed, well nourished, and in no acute distress.   MSK: Hands bilaterally bossing at first [facility].  Some DIP degenerative changes visible as well bilateral hands.  Normal hand motion.  Nontender.  Grip strength is intact.    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  No acute fractures are visible.  Rounded old avulsion fragment visible at first [facility].  This does not appear to be acute.  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  No acute fractures are present.  Await formal radiology review    Assessment and Plan: 62 y.o. female with bilateral hand pain at the base of the thumbs thought to be due to first [facility] DJD.  She is a good candidate for occupational therapy.  Plan for referral to OT hand therapy now.  Also recommend heat and Voltaren gel.  Tylenol arthritis is reasonable as well.  Recheck in 6 weeks if not improved consider injections.   PDMP not reviewed this encounter. Orders Placed This Encounter  Procedures   DG Hand Complete Left    Standing Status:   Future    Number of Occurrences:   1    Standing Expiration Date:    12/29/2022    Order Specific Question:   Reason for Exam (SYMPTOM  OR DIAGNOSIS REQUIRED)    Answer:   bilatal hand pain    Order Specific Question:   Preferred imaging location?    Answer:   [facility]    Order Specific Question:   Is patient pregnant?    Answer:   No   DG Hand Complete Right    Standing Status:   Future    Number of Occurrences:   1    Standing Expiration Date:   12/29/2022    Order Specific Question:   Reason for Exam (SYMPTOM  OR DIAGNOSIS REQUIRED)    Answer:   bilatal hand pain    Order Specific Question:   Preferred imaging location?    Answer:   [facility]    Order Specific Question:   Is patient pregnant?    Answer:   No   Ambulatory referral to Occupational Therapy    Referral Priority:   Routine    Referral Type:   Occupational Therapy    Referral Reason:   Specialty Services Required    Requested Specialty:   Occupational Therapy    Number of Visits Requested:   1   No orders of the defined types were placed in this encounter.    Discussed warning signs or symptoms. Please see discharge  instructions. Patient expresses understanding.   The above documentation has been reviewed and is accurate and complete Candice Bush, M.D.

## 2022-11-30 NOTE — Therapy (Signed)
OUTPATIENT OCCUPATIONAL THERAPY ORTHO EVALUATION  Patient Name: Candice Bush MRN: [id number] DOB:1960/08/04, 62 y.o., female Today's Date: 12/01/2022  PCP: Arva Chafe MD REFERRING PROVIDER:  Rodolph Bong, MD    END OF SESSION:  OT End of Session - 12/01/22 1053     Visit Number 1    Date for OT Re-Evaluation 01/12/23    Authorization Type [employer]    OT Start Time 1055    OT Stop Time 1145    OT Time Calculation (min) 50 min    Activity Tolerance Patient tolerated treatment well;No increased pain;Patient limited by fatigue;Patient limited by pain    Behavior During Therapy [facility] for tasks assessed/performed             Past Medical History:  Diagnosis Date   Hyperlipidemia    Migraine    Past Surgical History:  Procedure Laterality Date   NO PAST SURGERIES     Patient Active Problem List   Diagnosis Date Noted   Balance problem 05/04/2021   GAD (generalized anxiety disorder) 02/14/2018   Bilateral thumb pain 02/14/2018   Migraine     ONSET DATE: ~1 month in Lt hand, 10+ years in Rt hand   REFERRING DIAG: M79.644,M79.645 (ICD-10-CM) - Bilateral thumb pain   THERAPY DIAG:  Muscle weakness (generalized)  Other lack of coordination  Pain in joint of right hand  Pain in joint of left hand  Rationale for Evaluation and Treatment: Rehabilitation  SUBJECTIVE:   SUBJECTIVE STATEMENT: She is a retired Runner, 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 She states that lifting large cups, using a squeeze bottle, pouring a pot of coffee, and other hand relate activities are a challenge, painful to both of her thumbs. She has been trying to modify her environment. She does have a thicker neoprene glove that she wears at times. She states they hurt about the same right now.    PERTINENT HISTORY: Per MD notes: " bilat thumb pain; R-side started 10+ years ago, L-side about 4 weeks ago. She is R-hand dominate. Pt locates pain to the MCP of both  thumbs. She has modified things at home; changing cups, not using heavy cast iron pans. "   PRECAUTIONS: None  WEIGHT BEARING RESTRICTIONS: No  PAIN:  Are you having pain? Yes: NPRS scale: 1-2/10 Pain location: bil thumb basal joints Pain description: discomfort, sharp at times Aggravating factors: tight gripping on large objects Relieving factors: rest  FALLS: Has patient fallen in last 6 months? No  LIVING ENVIRONMENT: Lives with: lives with their family  PLOF: Independent  PATIENT GOALS: To obtain a planner program to help prevent thumb arthritis from getting worse and feel a bit better in the left hand and thumb   OBJECTIVE: (All objective assessments below are from initial evaluation on: 12/01/22 unless otherwise specified.)   HAND DOMINANCE: Right   ADLs: Overall ADLs: States decreased ability to grab, hold household objects, pain and inability to open containers, perform FMS tasks (manipulate fasteners on clothing), mild to moderate bathing problems as well.    FUNCTIONAL OUTCOME MEASURES: Eval: Quck DASH 22.7% impairment today  (Higher % Score  =  More Impairment)    UPPER EXTREMITY ROM     Shoulder to Wrist AROM Right eval Left eval  Wrist flexion 59 30  Wrist extension 60 68  (Blank rows = not tested)   Hand AROM Right eval Left eval  Full Fist Ability (or Gap to Distal Palmar Crease) full  full  Thumb Opposition  (Kapandji Scale)  10 9  Thumb MCP (0-60) 61 20  Thumb IP (0-80) 72 54  Thumb Radial Abduction Span 7cm span  7.2cm span  (Blank rows = not tested)  HAND FUNCTION: Eval: No significant observed weakness in hands, but Lt was mildly painful with grasp today.  Grip strength Right: 59 lbs, Left: 52 lbs   COORDINATION: Eval: Only mild observed coordination impairments with Lt hand vs right, details to be determined as needed   COGNITION: Eval: Overall cognitive status: WFL for evaluation today   OBSERVATIONS:   Eval: She has a significant  tightness in the left wrist and thumb compared to the right which explains her recent left pain.  No significant sensation issues, swelling.  Some tenderness to both thumb basal joints.   TODAY'S TREATMENT:  Post-evaluation treatment:   She was given a forearm preventing lateral pinch and increasing palmar pinch to help prevent breakdown of thumb joints as well as other recommendations for self-care like using scissors to open difficult packages, etc.  Additionally she was given a home exercise program called "the stable C" to address her bilateral thumb arthritis and tightness in the wrist and thumb especially on the left side.  OT educates her on these thoroughly and she has a few questions and needs repeat education but ultimately thinks she "has it" by the end of the session (only those bolded below).   Exercises "Stable - C"  - Seated Wrist Flexion Stretch  - 3 x daily - 3 reps - 15 hold - Wrist Prayer Stretch  - 3 x daily - 3 reps - 15 sec hold - Stretch Thumb DOWNWARD  - 3 x daily - 3 reps - 15 sec hold - Thumb Webspace Stretch  - 3 x daily - 3 reps - 15 sec hold - Towel Roll Grip with Forearm in Neutral  - 3 x daily - 5 reps - 10 sec hold - Spread Index Finger Apart  - 3 x daily - 5 reps - 5 sec hold - C-Strength (try using rubber band)   - 3 x daily - 5 reps - 5 sec hold    PATIENT EDUCATION: Education details: See tx section above for details  Person educated: Patient Education method: Verbal Instruction, Teach back, Handouts  Education comprehension: States and demonstrates understanding, Additional Education required    HOME EXERCISE PROGRAM: Access Code: [email or web address] URL: [email or web address] Date: 12/01/2022 Prepared by: Fannie Knee   GOALS: Goals reviewed with patient? Yes   SHORT TERM GOALS: (STG required if POC>30 days) Target Date: 12/08/22  Pt will obtain protective, custom orthotic. Goal status: TBD/PRN  2.  Pt will demo/state understanding  of initial HEP to improve pain levels and prerequisite motion. Goal status: INITIAL   LONG TERM GOALS: Target Date: 01/12/23  Pt will improve functional ability by decreased impairment per Quick DASH assessment from 22.7% to 10% or better, for better quality of life. Goal status: INITIAL  2.  Pt will improve grip strength in Lt hand from tender 52lbs to at least non-tender 55lbs for functional use at home and in IADLs. Goal status: INITIAL  3.  Pt will improve A/ROM in Lt wrist flex from 30* to at least 55*, to have functional motion for tasks like reach and grasp.  Goal status: INITIAL  4.  Pt will decrease pain at rest to 0/10 and at worst to 2/10 or better with all FNL activities to have better sleep and occupational participation  in daily roles. Goal status: INITIAL  ASSESSMENT:  CLINICAL IMPRESSION: Patient is a 62 y.o. female who was seen today for occupational therapy evaluation for support management of bilateral thumb arthritis which appears worse on the left arm today.  She will benefit from outpatient occupational therapy to increase quality of life.   PERFORMANCE DEFICITS: in functional skills including ADLs, IADLs, dexterity, ROM, pain, fascial restrictions, flexibility, Gross motor control, body mechanics, endurance, decreased knowledge of precautions, and UE functional use, cognitive skills including problem solving and safety awareness, and psychosocial skills including coping strategies, environmental adaptation, and habits.   IMPAIRMENTS: are limiting patient from ADLs, IADLs, leisure, and social participation.   COMORBIDITIES: may have co-morbidities  that affects occupational performance. Patient will benefit from skilled OT to address above impairments and improve overall function.  MODIFICATION OR ASSISTANCE TO COMPLETE EVALUATION: No modification of tasks or assist necessary to complete an evaluation.  OT OCCUPATIONAL PROFILE AND HISTORY: Problem focused  assessment: Including review of records relating to presenting problem.  CLINICAL DECISION MAKING: LOW - limited treatment options, no task modification necessary  REHAB POTENTIAL: Excellent  EVALUATION COMPLEXITY: Low      PLAN:  OT FREQUENCY: 1x/week  OT DURATION: 6 weeks  PLANNED INTERVENTIONS: self care/ADL training, therapeutic exercise, therapeutic activity, neuromuscular re-education, manual therapy, passive range of motion, splinting, fluidotherapy, compression bandaging, moist heat, cryotherapy, contrast bath, patient/family education, coping strategies training, DME and/or AE instructions, and Dry needling  RECOMMENDED OTHER SERVICES: none now   CONSULTED AND AGREED WITH PLAN OF CARE: Patient  PLAN FOR NEXT SESSION: Review home exercise program and recommendations to avoid wear-and-tear to bilateral thumbs and hands.  Try modalities and manual therapy as helpful as well.   Fannie Knee, OTR/L, CHT 12/01/2022, 12:01 PM

## 2022-12-01 ENCOUNTER — Other Ambulatory Visit: Payer: Self-pay

## 2022-12-01 ENCOUNTER — Encounter: Payer: Self-pay | Admitting: Rehabilitative and Restorative Service Providers"

## 2022-12-01 ENCOUNTER — Ambulatory Visit (INDEPENDENT_AMBULATORY_CARE_PROVIDER_SITE_OTHER): Payer: BC Managed Care – PPO | Admitting: Rehabilitative and Restorative Service Providers"

## 2022-12-01 DIAGNOSIS — M6281 Muscle weakness (generalized): Secondary | ICD-10-CM | POA: Diagnosis not present

## 2022-12-01 DIAGNOSIS — M25542 Pain in joints of left hand: Secondary | ICD-10-CM | POA: Diagnosis not present

## 2022-12-01 DIAGNOSIS — R278 Other lack of coordination: Secondary | ICD-10-CM

## 2022-12-01 DIAGNOSIS — M25541 Pain in joints of right hand: Secondary | ICD-10-CM

## 2022-12-01 DIAGNOSIS — M25632 Stiffness of left wrist, not elsewhere classified: Secondary | ICD-10-CM

## 2022-12-04 NOTE — Progress Notes (Signed)
Right hand x-ray shows arthritis especially at the base of the thumb.

## 2022-12-04 NOTE — Progress Notes (Signed)
Left hand x-ray shows some arthritis and what looks like an old injury to the base of the left thumb.

## 2022-12-06 NOTE — Therapy (Signed)
OUTPATIENT OCCUPATIONAL THERAPY ORTHO TREATMENT NOTE  Patient Name: Candice Bush MRN: [id number] DOB:[birth year], 62 y.o., female Today's Date: 12/08/2022  PCP: Arva Chafe MD REFERRING PROVIDER:  Rodolph Bong, MD    END OF SESSION:  OT End of Session - 12/08/22 1016     Visit Number 2    Number of Visits 7    Date for OT Re-Evaluation 01/12/23    Authorization Type [employer]    OT Start Time 1017    OT Stop Time 1100    OT Time Calculation (min) 43 min    Equipment Utilized During Treatment [email or web address]    Activity Tolerance Patient tolerated treatment well;No increased pain;Patient limited by fatigue;Patient limited by pain    Behavior During Therapy [facility] for tasks assessed/performed              Past Medical History:  Diagnosis Date   Hyperlipidemia    Migraine    Past Surgical History:  Procedure Laterality Date   NO PAST SURGERIES     Patient Active Problem List   Diagnosis Date Noted   Balance problem 05/04/2021   GAD (generalized anxiety disorder) 02/14/2018   Bilateral thumb pain 02/14/2018   Migraine     ONSET DATE: ~1 month in Lt hand, 10+ years in Rt hand   REFERRING DIAG: M79.644,M79.645 (ICD-10-CM) - Bilateral thumb pain   THERAPY DIAG:  Pain in joint of right hand  Other lack of coordination  Muscle weakness (generalized)  Pain in joint of left hand  Stiffness of left wrist, not elsewhere classified  Rationale for Evaluation and Treatment: Rehabilitation  PERTINENT HISTORY: Per MD notes: " bilat thumb pain; R-side started 10+ years ago, L-side about 4 weeks ago. She is R-hand dominate. Pt locates pain to the MCP of both thumbs. She has modified things at home; changing cups, not using heavy cast iron pans. 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 She states that lifting large cups, using a squeeze bottle, pouring a pot of coffee, and other hand relate activities are a challenge, painful to both  of her thumbs. She has been trying to modify her environment. She does have a thicker neoprene glove that she wears at times. She states they hurt about the same right now.   PRECAUTIONS: None; WEIGHT BEARING RESTRICTIONS: No   SUBJECTIVE:   SUBJECTIVE STATEMENT: She states having a little bit more pain than before but also feeling like her motion has improved.  She wonders if she is doing her exercises correctly and has questions.Marland Kitchen    PAIN:  Are you having pain?  Yes: NPRS scale: 3.5/10 Pain location: Lt thumb today Pain description: discomfort, sharp at times Aggravating factors: tight gripping on large objects Relieving factors: rest  FALLS: Has patient fallen in last 6 months? No  LIVING ENVIRONMENT: Lives with: lives with their family  PLOF: Independent  PATIENT GOALS: To obtain a planner program to help prevent thumb arthritis from getting worse and feel a bit better in the left hand and thumb   OBJECTIVE: (All objective assessments below are from initial evaluation on: 12/01/22 unless otherwise specified.)   HAND DOMINANCE: Right   ADLs: Overall ADLs: States decreased ability to grab, hold household objects, pain and inability to open containers, perform FMS tasks (manipulate fasteners on clothing), mild to moderate bathing problems as well.    FUNCTIONAL OUTCOME MEASURES: Eval: Quck DASH 22.7% impairment today  (Higher % Score  =  More Impairment)    UPPER EXTREMITY ROM     Shoulder to Wrist AROM Right eval Left eval Rt / Lt  12/08/22  Wrist flexion 59 30 83 / 66  Wrist extension 60 68 71 / 78  (Blank rows = not tested)   Hand AROM Right eval Left eval Lt 12/08/22  Full Fist Ability (or Gap to Distal Palmar Crease) full full   Thumb Opposition  (Kapandji Scale)  10 9   Thumb MCP (0-60) 61 20 40  Thumb IP (0-80) 72 54 56  Thumb Radial Abduction Span 7cm span  7.2cm span   (Blank rows = not tested)  HAND FUNCTION: Eval: No significant observed weakness  in hands, but Lt was mildly painful with grasp today.  Grip strength Right: 59 lbs, Left: 52 lbs   COORDINATION: Eval: Only mild observed coordination impairments with Lt hand vs right, details to be determined as needed   OBSERVATIONS:   Eval: She has a significant tightness in the left wrist and thumb compared to the right which explains her recent left pain.  No significant sensation issues, swelling.  Some tenderness to both thumb basal joints.   TODAY'S TREATMENT:  12/08/22: She starts with active range of motion for exercises as well as new measure check today which shows great improvement.  While she's on MH ~5 mins, OT reviews her HEP and recommendations from initial eval with her- with some changes and new suggestions for form. She was apparently pushing a bit hard, but this is corrected. Next, OT applies k-tape and if she has allergy to support the left painful thumb today.  When finished she states it feels supportive and good but she was educated to take off if it is bothering her for any reason.  She states feeling less pain at the end of the session.  Exercises "Stable - C"  - Seated Wrist Flexion Stretch  - 3 x daily - 3 reps - 15 hold - Wrist Prayer Stretch  - 3 x daily - 3 reps - 15 sec hold - Stretch Thumb DOWNWARD  - 3 x daily - 3 reps - 15 sec hold - Thumb Webspace Stretch  - 3 x daily - 3 reps - 15 sec hold - Towel Roll Grip with Forearm in Neutral  - 3 x daily - 5 reps - 10 sec hold - Spread Index Finger Apart  - 3 x daily - 5 reps - 5 sec hold - C-Strength (try using rubber band)   - 3 x daily - 5 reps - 5 sec hold   PATIENT EDUCATION: Education details: See tx section above for details  Person educated: Patient Education method: Verbal Instruction, Teach back, Handouts  Education comprehension: States and demonstrates understanding, Additional Education required    HOME EXERCISE PROGRAM: Access Code: [email or web address] URL: [email or web address] Date:  12/01/2022 Prepared by: Fannie Knee   GOALS: Goals reviewed with patient? Yes   SHORT TERM GOALS: (STG required if POC>30 days) Target Date: 12/08/22  Pt will obtain protective, custom orthotic. Goal status: TBD/PRN  2.  Pt will demo/state understanding of initial HEP to improve pain levels and prerequisite motion. Goal status: INITIAL   LONG TERM GOALS: Target Date: 01/12/23  Pt will improve functional ability by decreased impairment per Quick DASH assessment from 22.7% to 10% or better, for better quality of life. Goal status: INITIAL  2.  Pt will improve grip strength in Lt hand from tender 52lbs to at least non-tender 55lbs for functional  use at home and in IADLs. Goal status: INITIAL  3.  Pt will improve A/ROM in Lt wrist flex from 30* to at least 55*, to have functional motion for tasks like reach and grasp.  Goal status: INITIAL  4.  Pt will decrease pain at rest to 0/10 and at worst to 2/10 or better with all FNL activities to have better sleep and occupational participation in daily roles. Goal status: INITIAL  ASSESSMENT:  CLINICAL IMPRESSION: 12/08/22: After reviewing her home exercise program today she states feeling much more comfortable with that and she was likely pushing herself too hard causing a little bit more pain.  She feels better now, so we will carry on.  Eval: Patient is a 62 y.o. female who was seen today for occupational therapy evaluation for support management of bilateral thumb arthritis which appears worse on the left arm today.  She will benefit from outpatient occupational therapy to increase quality of life.    PLAN:  OT FREQUENCY: 1x/week  OT DURATION: 6 weeks  PLANNED INTERVENTIONS: self care/ADL training, therapeutic exercise, therapeutic activity, neuromuscular re-education, manual therapy, passive range of motion, splinting, fluidotherapy, compression bandaging, moist heat, cryotherapy, contrast bath, patient/family education,  coping strategies training, DME and/or AE instructions, and Dry needling  CONSULTED AND AGREED WITH PLAN OF CARE: Patient  PLAN FOR NEXT SESSION:  Continue with moist heat modalities as needed try massage as needed continue K tape as helpful, try to quickly review home exercises and add on the last 2 strengthening moves as helpful.   Fannie Knee, OTR/L, CHT 12/08/2022, 12:51 PM

## 2022-12-08 ENCOUNTER — Encounter: Payer: Self-pay | Admitting: Rehabilitative and Restorative Service Providers"

## 2022-12-08 ENCOUNTER — Ambulatory Visit (INDEPENDENT_AMBULATORY_CARE_PROVIDER_SITE_OTHER): Payer: BC Managed Care – PPO | Admitting: Rehabilitative and Restorative Service Providers"

## 2022-12-08 DIAGNOSIS — M25632 Stiffness of left wrist, not elsewhere classified: Secondary | ICD-10-CM

## 2022-12-08 DIAGNOSIS — M25541 Pain in joints of right hand: Secondary | ICD-10-CM | POA: Diagnosis not present

## 2022-12-08 DIAGNOSIS — R278 Other lack of coordination: Secondary | ICD-10-CM

## 2022-12-08 DIAGNOSIS — M6281 Muscle weakness (generalized): Secondary | ICD-10-CM

## 2022-12-08 DIAGNOSIS — M25542 Pain in joints of left hand: Secondary | ICD-10-CM | POA: Diagnosis not present

## 2022-12-14 NOTE — Therapy (Signed)
OUTPATIENT OCCUPATIONAL THERAPY ORTHO TREATMENT NOTE  Patient Name: Candice Bush MRN: [id number] DOB:August 21, 1960, 62 y.o., female Today's Date: 12/15/2022  PCP: Arva Chafe MD REFERRING PROVIDER:  Rodolph Bong, MD    END OF SESSION:  OT End of Session - 12/15/22 1019     Visit Number 3    Number of Visits 7    Date for OT Re-Evaluation 01/12/23    Authorization Type BCBS    OT Start Time 1019    OT Stop Time 1100    OT Time Calculation (min) 41 min    Equipment Utilized During Treatment --    Activity Tolerance Patient tolerated treatment well;No increased pain    Behavior During Therapy WFL for tasks assessed/performed             Past Medical History:  Diagnosis Date   Hyperlipidemia    Migraine    Past Surgical History:  Procedure Laterality Date   NO PAST SURGERIES     Patient Active Problem List   Diagnosis Date Noted   Balance problem 05/04/2021   GAD (generalized anxiety disorder) 02/14/2018   Bilateral thumb pain 02/14/2018   Migraine     ONSET DATE: ~1 month in Lt hand, 10+ years in Rt hand   REFERRING DIAG: M79.644,M79.645 (ICD-10-CM) - Bilateral thumb pain   THERAPY DIAG:  Pain in joint of right hand  Other lack of coordination  Muscle weakness (generalized)  Pain in joint of left hand  Stiffness of left wrist, not elsewhere classified  Rationale for Evaluation and Treatment: Rehabilitation  PERTINENT HISTORY: Per MD notes: " bilat thumb pain; R-side started 10+ years ago, L-side about 4 weeks ago. She is R-hand dominate. Pt locates pain to the MCP of both thumbs. She has modified things at home; changing cups, not using heavy cast iron pans. 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 She states that lifting large cups, using a squeeze bottle, pouring a pot of coffee, and other hand relate activities are a challenge, painful to both of her thumbs. She has been trying to modify her  environment. She does have a thicker neoprene glove that she wears at times. She states they hurt about the same right now.   PRECAUTIONS: None; WEIGHT BEARING RESTRICTIONS: No   SUBJECTIVE:   SUBJECTIVE STATEMENT: She states feeling less pain since modifying her stretches after the last session, but prior stretches still somewhat agitating.  She has new compression gloves that she finds helpful.   PAIN:  Are you having pain?  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    FUNCTIONAL OUTCOME MEASURES: Eval: Quck DASH 22.7% impairment today  (Higher % Score  =  More Impairment)    UPPER EXTREMITY ROM     Shoulder to Wrist AROM Right eval Left eval Rt / Lt  12/08/22 Rt / Oswaldo Milian  12/14/22  Wrist flexion 59 30 83 / 66 75 / 74  Wrist extension 60 68 71 / 78 74 / 80  (Blank rows = not tested)   Hand AROM Right eval Left eval Lt 12/08/22 Lt 12/14/22  Full Fist Ability (or Gap to Distal Palmar Crease) full full    Thumb Opposition  (Kapandji Scale)  10 9    Thumb MCP (0-60) 61 20 40 39  Thumb IP (0-80) 72 54 56 61  Thumb Radial Abduction Span 7cm span  7.2cm span    (Blank rows = not tested)  HAND FUNCTION: Eval: No significant observed weakness in hands, but Lt was mildly painful with grasp today.  Grip strength Right: 59 lbs, Left: 52 lbs    COORDINATION: Eval: Only mild observed coordination impairments with Lt hand vs right, details to be determined as needed   OBSERVATIONS:   Eval: She has a significant tightness in the left wrist and thumb compared to the right which explains her recent left pain.  No significant sensation issues, swelling.  Some tenderness to both thumb basal joints.   TODAY'S TREATMENT:  12/14/22: She performs active range of motion for exercise as well as new measures all showing good improvement since last seen.  OT does IASTM around the back of the thumb thumb flexors and thenar eminence as well as in the webspace to help with tightness and rigidity of the left thumb.  She states this feels helpful.  OT then reviews her home exercises, modifying the prayer stretch to be done in a single-handed fashion which she tolerates better now.  Also educating on additional first dorsal interossei strengthening and thumb flexor strengthening as bolded below.  OT carefully does stretches with her to ensure that she is feeling the appropriate nonpainful stretches and she demonstrates back.  And then new strengthening exercises were also performed and she tolerates these well.  She does still have episodes of guarding and holding her hands rigidly tight today for which she is given additional verbal cues for.    Exercises - Seated Wrist Flexion Stretch  - 3 x daily - 3 reps - 15 hold - Wrist Extension Stretch Pronated  - 4 x daily - 3-5 reps - 15 hold - Stretch Thumb DOWNWARD  - 3 x daily - 3 reps - 15 sec hold - Thumb Webspace Stretch  - 3 x daily - 3 reps - 15 sec hold - Towel Roll Grip with Forearm in Neutral  - 3 x daily - 5 reps - 10 sec hold - Spread Index Finger Apart  - 3 x daily - 5 reps - 5 sec hold - C-Strength (try using rubber band)   - 3 x daily - 5 reps - 5 sec hold    PATIENT EDUCATION: Education details: See tx section above for details  Person educated: Patient Education method: Verbal  Instruction, Teach back, Handouts  Education comprehension: States and demonstrates understanding, Additional Education required    HOME EXERCISE PROGRAM: Access Code: [email or web address] URL: [email or web address] Date: 12/01/2022 Prepared by: Fannie Knee   GOALS: Goals reviewed with patient? Yes   SHORT TERM GOALS: (STG required if POC>30 days) Target Date: 12/08/22  Pt will obtain protective, custom orthotic. Goal status: TBD/PRN  2.  Pt will demo/state understanding of initial HEP to improve pain levels and prerequisite motion. Goal status: 12/15/22: MET   LONG TERM GOALS: Target Date: 01/12/23  Pt will improve functional ability by decreased impairment per Quick DASH assessment from  22.7% to 10% or better, for better quality of life. Goal status: INITIAL  2.  Pt will improve grip strength in Lt hand from tender 52lbs to at least non-tender 55lbs for functional use at home and in IADLs. Goal status: INITIAL  3.  Pt will improve A/ROM in Lt wrist flex from 30* to at least 55*, to have functional motion for tasks like reach and grasp.  Goal status: INITIAL  4.  Pt will decrease pain at rest to 0/10 and at worst to 2/10 or better with all FNL activities to have better sleep and occupational participation in daily roles. Goal status: INITIAL  ASSESSMENT:  CLINICAL IMPRESSION: 12/15/22: She now notes full exercise program and is tolerating them somewhat better now.  She may only need 1-3 more visits to achieve all goals.  Her regarding habit and holding her hands rigidly is somewhat improving as she is trying to work on this but it still a factor and could create exacerbation.    PLAN:  OT FREQUENCY: 1x/week  OT DURATION: 6 weeks  PLANNED INTERVENTIONS: self care/ADL training, therapeutic exercise, therapeutic activity, neuromuscular re-education, manual therapy, passive range of motion, splinting, fluidotherapy, compression bandaging, moist heat, cryotherapy,  contrast bath, patient/family education, coping strategies training, DME and/or AE instructions, and Dry needling  CONSULTED AND AGREED WITH PLAN OF CARE: Patient  PLAN FOR NEXT SESSION:  Follow-up in a week to ensure full exercise program is going well, pain remains at a minimum and there have been little to no exacerbation.  If all going well at that point we could possibly discharge or put off therapy for a couple of weeks of self maintenance.   Fannie Knee, OTR/L, CHT 12/15/2022, 12:27 PM

## 2022-12-15 ENCOUNTER — Encounter: Payer: Self-pay | Admitting: Rehabilitative and Restorative Service Providers"

## 2022-12-15 ENCOUNTER — Ambulatory Visit (INDEPENDENT_AMBULATORY_CARE_PROVIDER_SITE_OTHER): Payer: BC Managed Care – PPO | Admitting: Rehabilitative and Restorative Service Providers"

## 2022-12-15 DIAGNOSIS — M25541 Pain in joints of right hand: Secondary | ICD-10-CM | POA: Diagnosis not present

## 2022-12-15 DIAGNOSIS — M25542 Pain in joints of left hand: Secondary | ICD-10-CM

## 2022-12-15 DIAGNOSIS — R278 Other lack of coordination: Secondary | ICD-10-CM | POA: Diagnosis not present

## 2022-12-15 DIAGNOSIS — M6281 Muscle weakness (generalized): Secondary | ICD-10-CM | POA: Diagnosis not present

## 2022-12-15 DIAGNOSIS — M25632 Stiffness of left wrist, not elsewhere classified: Secondary | ICD-10-CM

## 2022-12-21 NOTE — Therapy (Signed)
OUTPATIENT OCCUPATIONAL THERAPY ORTHO TREATMENT & DISCHARGE NOTE  Patient Name: Candice Bush MRN: [id number] DOB:14-Jan-1961, 62 y.o., female Today's Date: 12/22/2022  PCP: Arva Chafe MD REFERRING PROVIDER:  Rodolph Bong, MD     OCCUPATIONAL THERAPY DISCHARGE SUMMARY  Visits from Start of Care: 4  Current functional level related to goals / functional outcomes: Pt has met all goals to satisfactory levels and is pleased with outcomes.   Remaining deficits: Pt has no more significant functional deficits or pain.   Education / Equipment: Pt has all needed materials and education. Pt understands how to continue on with self-management. See tx notes for more details.   Patient agrees to discharge due to max benefits received from outpatient occupational therapy / hand therapy at this time.   Fannie Knee, OTR/L, CHT 12/22/22     END OF SESSION:  OT End of Session - 12/22/22 1009     Visit Number 4    Number of Visits 7    Date for OT Re-Evaluation 01/12/23    Authorization Type [employer]    OT Start Time 1010    OT Stop Time 1030    OT Time Calculation (min) 20 min    Activity Tolerance Patient tolerated treatment well;No increased pain    Behavior During Therapy WFL for tasks assessed/performed              Past Medical History:  Diagnosis Date   Hyperlipidemia    Migraine    Past Surgical History:  Procedure Laterality Date   NO PAST SURGERIES     Patient Active Problem List   Diagnosis Date Noted   Balance problem 05/04/2021   GAD (generalized anxiety disorder) 02/14/2018   Bilateral thumb pain 02/14/2018   Migraine     ONSET DATE: ~1 month in Lt hand, 10+ years in Rt hand   REFERRING DIAG: M79.644,M79.645 (ICD-10-CM) - Bilateral thumb pain   THERAPY DIAG:  Pain in joint of right hand  Other lack of coordination  Muscle weakness (generalized)  Pain in joint of left hand  Stiffness of left wrist, not elsewhere  classified  Rationale for Evaluation and Treatment: Rehabilitation  PERTINENT HISTORY: Per MD notes: " bilat thumb pain; R-side started 10+ years ago, L-side about 4 weeks ago. She is R-hand dominate. Pt locates pain to the MCP of both thumbs. She has modified things at home; changing cups, not using heavy cast iron pans. 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 She states that lifting large cups, using a squeeze bottle, pouring a pot of coffee, and other hand relate activities are a challenge, painful to both of her thumbs. She has been trying to modify her environment. She does have a thicker neoprene glove that she wears at times. She states they hurt about the same right now.   PRECAUTIONS: None; WEIGHT BEARING RESTRICTIONS: No   SUBJECTIVE:   SUBJECTIVE STATEMENT: She states some confusion with wrist ext stretch and having mild 2/10 pain in volar wrist now, but all else going well, happy with no thumb pain now.    PAIN:  Are you having pain?   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    FUNCTIONAL OUTCOME MEASURES: 12/22/22: Quick DASH: 6% Impairment today    Eval: Quck DASH 22.7% impairment today  (Higher % Score  =  More Impairment)    UPPER EXTREMITY ROM     Shoulder to Wrist AROM Right eval Left eval Rt  /  Lt 12/22/22  Wrist flexion 59 30 80 / 80  Wrist extension 60 68 71 / 79  (Blank rows = not  tested)   Hand AROM Right eval Left eval Rt / Lt 12/22/22  Full Fist Ability (or Gap to Distal Palmar Crease) full full full  Thumb Opposition  (Kapandji Scale)  10 9 10   Thumb MCP (0-60) 61 20 53 /  34  Thumb IP (0-80) 72 54 59  /  57  Thumb Radial Abduction Span 7cm span  7.2cm span   (Blank rows = not tested)  HAND FUNCTION: 12/22/22: Lt grip :50#  Eval: No significant observed weakness in hands, but Lt was mildly painful with grasp today.  Grip strength Right: 59 lbs, Left: 52 lbs     TODAY'S TREATMENT:  12/22/22: She performs range of motion for exercise as well as new measures, grip activity, discussion of home and functional activities for self-care and also a progress note today. Due to mild left volar wrist tenderness and also some measurable, lingering Lt thumb stiffness and left thumb still compared to right, OT does emphasize MCP stretch when performing her thumb composite flexion stretch also we review wrist extension stretches several times before reviewing her whole home exercise program.   Exercises/Activities reviewed today:  - Seated Wrist Flexion Stretch  - 3 x daily - 3 reps - 15 hold - Wrist Extension Stretch Pronated  - 4 x daily - 3-5 reps - 15 hold - Stretch Thumb DOWNWARD  - 3 x daily - 3 reps - 15 sec hold - Thumb Webspace Stretch  - 3 x daily - 3 reps - 15 sec hold - Towel Roll Grip with Forearm in Neutral  - 3 x daily - 5 reps - 10 sec hold - Spread Index Finger Apart  - 3 x daily - 5 reps - 5 sec hold - C-Strength (try using rubber band)   - 3 x daily - 5 reps - 5 sec hold    PATIENT EDUCATION: Education details: See tx section above for details  Person educated: Patient Education method: Verbal Instruction, Teach back, Handouts  Education comprehension: States and demonstrates understanding  HOME EXERCISE PROGRAM: Access Code: [email or web address] URL: [email or web address] Date: 12/01/2022 Prepared by: Fannie Knee   GOALS: Goals  reviewed with patient? Yes   SHORT TERM GOALS: (STG required if POC>30 days) Target Date: 12/08/22  Pt will obtain protective, custom orthotic. Goal status: DISCHARGE  2.  Pt will demo/state understanding of initial HEP to improve pain levels and prerequisite motion. Goal status: 12/15/22: MET   LONG TERM GOALS: Target Date: 01/12/23  Pt will improve functional ability by decreased impairment per Quick DASH assessment from 22.7% to 10% or better, for better quality of life. Goal status: 12/22/22: MET! 6% now  2.  Pt will improve grip strength in Lt hand from tender 52lbs to at least non-tender 55lbs for functional use at home and in IADLs. Goal status: 12/22/22: Not  met, still about 50# but grip was also not significantly weak at start of care.   3.  Pt will improve A/ROM in Lt wrist flex from 30* to at least 55*, to have functional motion for tasks like reach and grasp.  Goal status: 12/22/22: MET! Now 80*   4.  Pt will decrease pain at rest to 0/10 and at worst to 2/10 or better with all FNL activities to have better sleep and occupational participation in daily roles. Goal status: 12/22/22: Partially met- 2/10 pain at worst MET but also 2/10 pain at rest today (due to inconsistencies with wrist stretches)   ASSESSMENT:  CLINICAL IMPRESSION: 12/22/22: She has improved her motion at the wrist and thumb dramatically since the start of her care and now rates her impairment much lower.  We have discussed compensation and modifications as well as long-term management for thumb and wrist pain.  She has a full comprehensive program that is simple and easy to work on and has no more questions or concerns.  We will discharge successfully today after only 4 visits   PLAN:  OT FREQUENCY: & OT DURATION: D/C now  PLANNED INTERVENTIONS: self care/ADL training, therapeutic exercise, therapeutic activity, neuromuscular re-education, manual therapy, passive range of motion, splinting, fluidotherapy,  compression bandaging, moist heat, cryotherapy, contrast bath, patient/family education, coping strategies training, DME and/or AE instructions, and Dry needling  CONSULTED AND AGREED WITH PLAN OF CARE: Patient  PLAN FOR NEXT SESSION:  Successful discharge today   Fannie Knee, OTR/L, CHT 12/22/2022, 10:40 AM

## 2022-12-22 ENCOUNTER — Ambulatory Visit (INDEPENDENT_AMBULATORY_CARE_PROVIDER_SITE_OTHER): Payer: BC Managed Care – PPO | Admitting: Rehabilitative and Restorative Service Providers"

## 2022-12-22 ENCOUNTER — Encounter: Payer: Self-pay | Admitting: Rehabilitative and Restorative Service Providers"

## 2022-12-22 DIAGNOSIS — R278 Other lack of coordination: Secondary | ICD-10-CM

## 2022-12-22 DIAGNOSIS — M25542 Pain in joints of left hand: Secondary | ICD-10-CM

## 2022-12-22 DIAGNOSIS — M25541 Pain in joints of right hand: Secondary | ICD-10-CM

## 2022-12-22 DIAGNOSIS — M25632 Stiffness of left wrist, not elsewhere classified: Secondary | ICD-10-CM

## 2022-12-22 DIAGNOSIS — M6281 Muscle weakness (generalized): Secondary | ICD-10-CM | POA: Diagnosis not present

## 2023-01-09 ENCOUNTER — Ambulatory Visit: Payer: BC Managed Care – PPO | Admitting: Family Medicine

## 2023-03-26 ENCOUNTER — Encounter: Payer: Self-pay | Admitting: Family Medicine

## 2023-03-26 ENCOUNTER — Ambulatory Visit: Payer: BC Managed Care – PPO | Admitting: Family Medicine

## 2023-03-26 VITALS — BP 112/75 | HR 100 | Temp 97.8°F | Ht 66.5 in | Wt 167.0 lb

## 2023-03-26 DIAGNOSIS — R631 Polydipsia: Secondary | ICD-10-CM

## 2023-03-26 DIAGNOSIS — R519 Headache, unspecified: Secondary | ICD-10-CM | POA: Diagnosis not present

## 2023-03-26 DIAGNOSIS — H6690 Otitis media, unspecified, unspecified ear: Secondary | ICD-10-CM

## 2023-03-26 MED ORDER — AMOXICILLIN-POT CLAVULANATE 875-125 MG PO TABS: 1.0000 | ORAL_TABLET | Freq: Two times a day (BID) | ORAL | 0 refills | Status: AC

## 2023-03-26 MED ORDER — PREDNISONE 20 MG PO TABS: 40.0000 mg | ORAL_TABLET | Freq: Every day | ORAL | 0 refills | Status: AC

## 2023-03-26 NOTE — Progress Notes (Signed)
Acute Office Visit  Subjective:     Patient ID: Candice Bush, female    DOB: Jan 14, 1961, 62 y.o.   MRN: [id number]  Chief Complaint  Patient presents with   Headache     Patient is in today for headache  Discussed the use of AI scribe software for clinical note transcription with the patient, who gave verbal consent to proceed.  History of Present Illness   The patient, with a past medical history of migraines, presents with a severe, right-sided headache that began last Wednesday. The headache was initially thought to be due to weather changes, as the patient often experiences headaches with such changes. However, the headache persisted and intensified, becoming reminiscent of the migraines the patient used to experience prior to menopause. The headache has been unresponsive to various over-the-counter medications, including Alka-Seltzer, Sudafed, and Excedrin Migraine. The patient resorted to taking expired hydrocodone, which provided some relief but also caused itching. The headache is described as a stabbing pain that varies in location, sometimes being over the right eye, ethmoid sinuses, maxillary sinuses, ear, or at the back of the head.  In addition to the headache, the patient has noticed a recent sensitivity to hot and cold in a tooth on the right side. This sensitivity began about five days ago, but the patient had noticed sensitivity to cool air in the same area about a month ago during a teeth cleaning.  The patient also reports a constant thirst and dry lips, despite drinking water throughout the day. The patient denies any excessive hunger or urination.             All review of systems negative except what is listed in the HPI      Objective:    BP 112/75   Pulse 100   Temp 97.8 F (36.6 C) (Oral)   Ht 5' 6.5" (1.689 m)   Wt 167 lb (75.8 kg)   LMP 04/13/2012   SpO2 98%   BMI 26.55 kg/m    Physical Exam Vitals reviewed.  Constitutional:       Appearance: Normal appearance.  HENT:     Head: Normocephalic and atraumatic.     Right Ear: Tympanic membrane is erythematous and bulging.     Left Ear: Tympanic membrane normal.     Nose: Congestion present.     Mouth/Throat:     Mouth: Mucous membranes are moist.     Pharynx: Oropharynx is clear. No oropharyngeal exudate or posterior oropharyngeal erythema.  Eyes:     Extraocular Movements: Extraocular movements intact.     Conjunctiva/sclera: Conjunctivae normal.  Cardiovascular:     Rate and Rhythm: Normal rate and regular rhythm.     Heart sounds: Normal heart sounds.  Pulmonary:     Effort: Pulmonary effort is normal.     Breath sounds: Normal breath sounds.  Musculoskeletal:     Cervical back: Normal range of motion and neck supple. No tenderness.  Lymphadenopathy:     Cervical: No cervical adenopathy.  Skin:    General: Skin is warm and dry.  Neurological:     Mental Status: She is alert and oriented to person, place, and time.  Psychiatric:        Mood and Affect: Mood normal.        Behavior: Behavior normal.        Thought Content: Thought content normal.        Judgment: Judgment normal.     No results found for  any visits on 03/26/23.      Assessment & Plan:   Problem List Items Addressed This Visit   None Visit Diagnoses     Acute nonintractable headache, unspecified headache type    -  Primary   Relevant Medications   predniSONE (DELTASONE) 20 MG tablet   Excessive thirst       Acute otitis media, unspecified otitis media type       Relevant Medications   amoxicillin-clavulanate (AUGMENTIN) 875-125 MG tablet   predniSONE (DELTASONE) 20 MG tablet         Severe Right-Sided Headache History of migraines, but this headache is more severe and persistent. Tried multiple over-the-counter medications without relief. Some relief with expired hydrocodone, but developed itching. No neurologic symptoms. -Start Augmentin for suspected sinusitis and right  otitis media. -Start prednisone for 5 days. -Use extra strength Tylenol for pain control during prednisone course. -Continue warm compresses to sinuses, head, and ear. -Check with dentist for possible dental issues.   Excessive thirst, dry mouth: Denied excessive urination or hunger. Reports she is taking an antihistamine for allergies, this could contribute. Offered labs today, but she declined. Trial of Biotine oral rinse. Patient aware of signs/symptoms requiring further/urgent evaluation.        Meds ordered this encounter  Medications   amoxicillin-clavulanate (AUGMENTIN) 875-125 MG tablet    Sig: Take 1 tablet by mouth 2 (two) times daily for 7 days.    Dispense:  14 tablet    Refill:  0    Order Specific Question:   Supervising Provider    Answer:   Danise Edge A [[id number]]   predniSONE (DELTASONE) 20 MG tablet    Sig: Take 2 tablets (40 mg total) by mouth daily with breakfast for 5 days.    Dispense:  10 tablet    Refill:  0    Order Specific Question:   Supervising Provider    Answer:   Danise Edge A [[id number]]    Return if symptoms worsen or fail to improve.  Clayborne Dana, NP

## 2023-04-03 ENCOUNTER — Other Ambulatory Visit: Payer: Self-pay | Admitting: Family Medicine

## 2023-04-03 ENCOUNTER — Encounter: Payer: Self-pay | Admitting: Family Medicine

## 2023-04-03 DIAGNOSIS — Z1283 Encounter for screening for malignant neoplasm of skin: Secondary | ICD-10-CM

## 2023-04-20 ENCOUNTER — Ambulatory Visit: Payer: BC Managed Care – PPO | Admitting: Family Medicine

## 2023-04-20 ENCOUNTER — Encounter: Payer: Self-pay | Admitting: Family Medicine

## 2023-04-20 VITALS — BP 110/74 | HR 70 | Temp 98.1°F | Ht 66.5 in | Wt 171.2 lb

## 2023-04-20 DIAGNOSIS — H6592 Unspecified nonsuppurative otitis media, left ear: Secondary | ICD-10-CM | POA: Diagnosis not present

## 2023-04-20 MED ORDER — PREDNISONE 20 MG PO TABS: 40.0000 mg | ORAL_TABLET | Freq: Every day | ORAL | 0 refills | Status: AC

## 2023-04-20 MED ORDER — FLUTICASONE PROPIONATE 50 MCG/ACT NA SUSP: 2.0000 | Freq: Every day | NASAL | 2 refills | Status: DC

## 2023-04-20 MED ORDER — VALACYCLOVIR HCL 500 MG PO TABS: ORAL_TABLET | ORAL | 1 refills | Status: DC

## 2023-04-20 MED ORDER — CITALOPRAM HYDROBROMIDE 10 MG PO TABS: ORAL_TABLET | ORAL | 3 refills | Status: DC

## 2023-04-20 NOTE — Progress Notes (Signed)
Chief Complaint  Patient presents with   Ear Pain    Left ear     Pt is here for left ear pain. Duration: 1  d Progression: worse Associated symptoms: Hx of TMJ, chronic nasal congestion Denies: new congestion, dental issues, trauma, coryza, bleeding, or discharge from ear Treatment to date: hydrocodone, Debrox  Past Medical History:  Diagnosis Date   Hyperlipidemia    Migraine     BP 110/74 (BP Location: Left Arm, Patient Position: Sitting, Cuff Size: Normal)   Pulse 70   Temp 98.1 F (36.7 C) (Oral)   Ht 5' 6.5" (1.689 m)   Wt 171 lb 4 oz (77.7 kg)   LMP 04/13/2012   SpO2 93%   BMI 27.23 kg/m  General: Awake, alert, appearing stated age HEENT:  L ear- Canal patent without drainage or erythema, TM is not bulging but w serous fluid R ear- canal patent without drainage or erythema, TM is neg Nose- nares patent and without discharge Mouth- Lips, gums and dentition unremarkable, pharynx is without erythema or exudate Neck: No adenopathy MSK: No TMJ ttp b/l Lungs: Normal effort, no accessory muscle use Psych: Age appropriate judgment and insight, normal mood and affect  Fluid level behind tympanic membrane of left ear - Plan: predniSONE (DELTASONE) 20 MG tablet  5 d pred burst, 40 mg/d. INCS for prevention in the future. Tylenol prn. Send message if worsening.  F/u prn.  Pt voiced understanding and agreement to the plan.  Jilda Roche Nibley, DO 04/20/23 10:29 AM

## 2023-04-20 NOTE — Patient Instructions (Addendum)
OK to take Tylenol 1000 mg (2 extra strength tabs) or 975 mg (3 regular strength tabs) every 6 hours as needed.  Consider Flonase or another steroid nasal spray to help prevent this.   Let us know if you need anything.

## 2023-06-06 ENCOUNTER — Encounter: Payer: Self-pay | Admitting: Family Medicine

## 2023-06-08 ENCOUNTER — Ambulatory Visit: Payer: BC Managed Care – PPO | Admitting: Family Medicine

## 2023-06-08 ENCOUNTER — Other Ambulatory Visit: Payer: Self-pay

## 2023-06-08 ENCOUNTER — Ambulatory Visit (INDEPENDENT_AMBULATORY_CARE_PROVIDER_SITE_OTHER): Payer: BC Managed Care – PPO

## 2023-06-08 VITALS — BP 148/88 | HR 77 | Ht 66.5 in | Wt 175.0 lb

## 2023-06-08 DIAGNOSIS — G8929 Other chronic pain: Secondary | ICD-10-CM

## 2023-06-08 DIAGNOSIS — M25512 Pain in left shoulder: Secondary | ICD-10-CM

## 2023-06-08 NOTE — Progress Notes (Signed)
Rubin Payor, PhD, LAT, ATC acting as a scribe for [name], MD.  Candice Bush is a 62 y.o. female who presents to [facility] at [facility] today for L shoulder pain. Pt was previously seen by Dr. Denyse Amass on 11/28/22 for bilat thumb pain.  Today, pt c/o L shoulder pain ongoing for about a month. She notes she got the flu shot on 10/27 and feels like it was administered at a higher location on her L arm. She also was traveling, lifting heavy luggage and carrying a heavy backpack. Pt locates pain to posterior aspect of her L shoulder and into the upper arm  Radiates: no Aggravates: taking off shirt, aBd Treatments tried: prior CSI, Tylenol  Pertinent review of systems: No fevers or chills  Relevant historical information: Generalized anxiety disorder. 10 years ago at an orthopedic office in [facility] she had a cortisone shot in her shoulder which helped a lot.  She cannot remember which shoulder.  Exam:  BP (!) 148/88   Pulse 77   Ht 5' 6.5" (1.689 m)   Wt 175 lb (79.4 kg)   LMP 04/13/2012   SpO2 97%   BMI 27.82 kg/m  General: Well Developed, well nourished, and in no acute distress.   MSK: Left shoulder normal-appearing Normal motion pain with abduction. Intact strength. Positive Hawkins and Neer's test.    Lab and Radiology Results  Procedure: Real-time Ultrasound Guided Injection of her left shoulder subacromial bursa Device: Philips Affiniti 50G/GE Logiq Images permanently stored and available for review in PACS Verbal informed consent obtained.  Discussed risks and benefits of procedure. Warned about infection, bleeding, hyperglycemia damage to structures among others. Patient expresses understanding and agreement Time-out conducted.   Noted no overlying erythema, induration, or other signs of local infection.   Skin prepped in a sterile fashion.   Local anesthesia: Topical Ethyl chloride.   With sterile technique and under real time  ultrasound guidance: 40 mg Kenalog and 2 mL of Marcaine injected into subacromial bursa. Fluid seen entering the bursa.   Completed without difficulty   Pain immediately resolved suggesting accurate placement of the medication.   Advised to call if fevers/chills, erythema, induration, drainage, or persistent bleeding.   Images permanently stored and available for review in the ultrasound unit.  Impression: Technically successful ultrasound guided injection.   X-ray images left shoulder obtained today personally and independently interpreted Minimal AC DJD.  No acute fractures. Await formal radiology review      Assessment and Plan: 62 y.o. female with left shoulder pain occurring acutely following a flu vaccine and increased activity with travel.  Pain due to subacromial bursitis.  Plan for steroid injection today and home exercise program.  Consider physical therapy if not improving.   PDMP not reviewed this encounter. Orders Placed This Encounter  Procedures   DG Shoulder Left    Standing Status:   Future    Number of Occurrences:   1    Standing Expiration Date:   07/09/2023    Order Specific Question:   Reason for Exam (SYMPTOM  OR DIAGNOSIS REQUIRED)    Answer:   left shoulder pain    Order Specific Question:   Preferred imaging location?    Answer:   [facility]    Order Specific Question:   Is patient pregnant?    Answer:   No   [place] LIMITED JOINT SPACE STRUCTURES UP LEFT(NO LINKED CHARGES)    Order Specific Question:  Reason for Exam (SYMPTOM  OR DIAGNOSIS REQUIRED)    Answer:   left shoulder pain    Order Specific Question:   Preferred imaging location?    Answer:   [facility]   No orders of the defined types were placed in this encounter.    Discussed warning signs or symptoms. Please see discharge instructions. Patient expresses understanding.   The above documentation has been reviewed and is accurate and complete [name],  M.D.

## 2023-06-08 NOTE — Patient Instructions (Addendum)
Thank you for coming in today.   Please get an Xray today before you leave   You received an injection today. Seek immediate medical attention if the joint becomes red, extremely painful, or is oozing fluid.

## 2023-06-19 NOTE — Progress Notes (Signed)
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

## 2023-12-16 ENCOUNTER — Other Ambulatory Visit: Payer: Self-pay | Admitting: Family Medicine

## 2024-01-25 ENCOUNTER — Encounter: Payer: Self-pay | Admitting: Family Medicine

## 2024-01-25 ENCOUNTER — Ambulatory Visit (INDEPENDENT_AMBULATORY_CARE_PROVIDER_SITE_OTHER): Admitting: Family Medicine

## 2024-01-25 VITALS — BP 130/82 | HR 99 | Temp 98.0°F | Resp 16 | Ht 66.0 in | Wt 173.0 lb

## 2024-01-25 DIAGNOSIS — H6993 Unspecified Eustachian tube disorder, bilateral: Secondary | ICD-10-CM

## 2024-01-25 MED ORDER — PREDNISONE 20 MG PO TABS: 40.0000 mg | ORAL_TABLET | Freq: Every day | ORAL | 0 refills | Status: AC

## 2024-01-25 NOTE — Patient Instructions (Signed)
 Continue your home treatments.   Let us  know if you need anything.

## 2024-01-25 NOTE — Progress Notes (Signed)
 Chief Complaint  Patient presents with   Ear Fullness    Ear Fullness     Candice Bush here for URI complaints.  Duration: 2 weeks  Associated symptoms: sinus pain, ear fullness, and dental pain, scratchy throat Denies: sinus congestion, rhinorrhea, itchy watery eyes, ear pain, ear drainage, sore throat, wheezing, shortness of breath, myalgia, and fevers Treatment to date: Netty [place], saline rinses, INCS,  Sick contacts: No  Past Medical History:  Diagnosis Date   Hyperlipidemia    Migraine     Objective BP 130/82 (BP Location: Left Arm, Patient Position: Sitting)   Pulse 99   Temp 98 F (36.7 C) (Oral)   Resp 16   Ht 5' 6 (1.676 m)   Wt 173 lb (78.5 kg)   LMP 04/13/2012   SpO2 100%   BMI 27.92 kg/m  General: Awake, alert, appears stated age HEENT: AT, [place], ears patent b/l and TM's neg, nares patent w/o discharge, pharynx pink and without exudates, MMM, no sinus ttp Neck: No masses or asymmetry Heart: RRR Lungs: CTAB, no accessory muscle use Psych: Age appropriate judgment and insight, normal mood and affect  Dysfunction of both eustachian tubes - Plan: predniSONE  (DELTASONE ) 20 MG tablet  5 d pred burst 40 mg/d. Continue to push fluids, practice good hand hygiene, cover mouth when coughing. F/u prn. If starting to experience fevers, shaking, or shortness of breath, seek immediate care. Pt voiced understanding and agreement to the plan.  [name], DO 01/25/24 12:57 PM

## 2024-03-04 ENCOUNTER — Ambulatory Visit: Payer: Self-pay | Admitting: Family Medicine

## 2024-03-04 ENCOUNTER — Ambulatory Visit (INDEPENDENT_AMBULATORY_CARE_PROVIDER_SITE_OTHER): Admitting: Family Medicine

## 2024-03-04 ENCOUNTER — Other Ambulatory Visit: Payer: Self-pay

## 2024-03-04 ENCOUNTER — Encounter: Payer: Self-pay | Admitting: Family Medicine

## 2024-03-04 VITALS — BP 126/74 | HR 93 | Temp 98.0°F | Resp 16 | Ht 66.0 in | Wt 174.0 lb

## 2024-03-04 DIAGNOSIS — E7800 Pure hypercholesterolemia, unspecified: Secondary | ICD-10-CM

## 2024-03-04 DIAGNOSIS — E010 Iodine-deficiency related diffuse (endemic) goiter: Secondary | ICD-10-CM

## 2024-03-04 DIAGNOSIS — Z23 Encounter for immunization: Secondary | ICD-10-CM | POA: Diagnosis not present

## 2024-03-04 DIAGNOSIS — Z0000 Encounter for general adult medical examination without abnormal findings: Secondary | ICD-10-CM | POA: Diagnosis not present

## 2024-03-04 DIAGNOSIS — M25362 Other instability, left knee: Secondary | ICD-10-CM | POA: Diagnosis not present

## 2024-03-04 LAB — T4, FREE: Free T4: 0.76 ng/dL (ref 0.60–1.60)

## 2024-03-04 LAB — TSH: TSH: 0.56 u[IU]/mL (ref 0.35–5.50)

## 2024-03-04 LAB — CBC
HCT: 42.3 % (ref 36.0–46.0)
Hemoglobin: 14 g/dL (ref 12.0–15.0)
MCHC: 33.2 g/dL (ref 30.0–36.0)
MCV: 87.5 fl (ref 78.0–100.0)
Platelets: 276 K/uL (ref 150.0–400.0)
RBC: 4.84 Mil/uL (ref 3.87–5.11)
RDW: 12.7 % (ref 11.5–15.5)
WBC: 4.4 K/uL (ref 4.0–10.5)

## 2024-03-04 LAB — COMPREHENSIVE METABOLIC PANEL WITH GFR
ALT: 28 U/L (ref 0–35)
AST: 21 U/L (ref 0–37)
Albumin: 4.5 g/dL (ref 3.5–5.2)
Alkaline Phosphatase: 65 U/L (ref 39–117)
BUN: 19 mg/dL (ref 6–23)
CO2: 30 meq/L (ref 19–32)
Calcium: 9.6 mg/dL (ref 8.4–10.5)
Chloride: 102 meq/L (ref 96–112)
Creatinine, Ser: 0.81 mg/dL (ref 0.40–1.20)
GFR: 78.5 mL/min (ref >60.00)
Glucose, Bld: 92 mg/dL (ref 70–99)
Potassium: 4.9 meq/L (ref 3.5–5.1)
Sodium: 140 meq/L (ref 135–145)
Total Bilirubin: 0.5 mg/dL (ref 0.2–1.2)
Total Protein: 7.1 g/dL (ref 6.0–8.3)

## 2024-03-04 LAB — LIPID PANEL
Cholesterol: 262 mg/dL — ABNORMAL HIGH (ref 0–200)
HDL: 57.6 mg/dL (ref >39.00)
LDL Cholesterol: 172 mg/dL — ABNORMAL HIGH (ref 0–99)
NonHDL: 204.66
Total CHOL/HDL Ratio: 5
Triglycerides: 163 mg/dL — ABNORMAL HIGH (ref 0.0–149.0)
VLDL: 32.6 mg/dL (ref 0.0–40.0)

## 2024-03-04 MED ORDER — VALACYCLOVIR HCL 500 MG PO TABS: ORAL_TABLET | ORAL | 1 refills | Status: AC

## 2024-03-04 MED ORDER — CITALOPRAM HYDROBROMIDE 10 MG PO TABS: ORAL_TABLET | ORAL | 3 refills | Status: AC

## 2024-03-04 NOTE — Progress Notes (Signed)
 Chief Complaint  Patient presents with   Annual Exam    CPE     Well Woman Candice Bush is here for a complete physical.   Her last physical was >1 year ago.  Current diet: in general, a healthy diet. Current exercise: walking, balance. Weight is stable and she denies fatigue out of ordinary. Patient's last menstrual period was 04/13/2012. Seatbelt? Yes Advanced directive? No  Health Maintenance Pap/HPV- Yes Mammogram- Yes Colon cancer screening-Yes Shingrix- Yes Tetanus- Due Hep C screening- Yes HIV screening- Yes  Patient fell in a grocery store 1 month ago after slipping on a grape.  She still having some intermittent pain over her left kneecap and random episodes of her knee giving out.  This happened several times per day.  She has not fallen.  There is no pain associated with it giving out.  No bruising, redness, or swelling anymore.  Past Medical History:  Diagnosis Date   Hyperlipidemia    Migraine      Past Surgical History:  Procedure Laterality Date   NO PAST SURGERIES      Medications  Current Outpatient Medications on File Prior to Visit  Medication Sig Dispense Refill   fish oil-omega-3 fatty acids 1000 MG capsule Take 2 g by mouth daily.     fluticasone  (FLONASE ) 50 MCG/ACT nasal spray SHAKE LIQUID AND USE 2 SPRAYS IN EACH NOSTRIL DAILY 16 g 2   Allergies Allergies  Allergen Reactions   Nickel Other (See Comments)    Patient states that costume jewelry will create discoloration.    Review of Systems: Constitutional:  no unexpected weight changes Eye:  no recent significant change in vision Ear/Nose/Mouth/Throat:  Ears:  no recent change in hearing Nose/Mouth/Throat:  no complaints of nasal congestion, no sore throat Cardiovascular: no chest pain Respiratory:  no shortness of breath Gastrointestinal:  no abdominal pain, no change in bowel habits GU:  Female: negative for dysuria or pelvic pain Musculoskeletal/Extremities: As noted in  HPI Integumentary (Skin/Breast):  no abnormal skin lesions reported Neurologic:  no headaches Endocrine:  denies fatigue  Exam BP 126/74 (BP Location: Left Arm, Patient Position: Sitting)   Pulse 93   Temp 98 F (36.7 C) (Oral)   Resp 16   Ht 5' 6 (1.676 m)   Wt 174 lb (78.9 kg)   LMP 04/13/2012   SpO2 97%   BMI 28.08 kg/m  General:  well developed, well nourished, in no apparent distress Skin:  no significant moles, warts, or growths Head:  no masses, lesions, or tenderness Eyes:  pupils equal and round, sclera anicteric without injection Ears:  canals without lesions, TMs shiny without retraction, no obvious effusion, no erythema Nose:  nares patent, mucosa normal, and no drainage  Throat/Pharynx:  lips and gingiva without lesion; tongue and uvula midline; non-inflamed pharynx; no exudates or postnasal drainage Neck: neck supple without adenopathy, + thyromegaly without TTP Lungs:  clear to auscultation, breath sounds equal bilaterally, no respiratory distress Cardio:  regular rate and rhythm, no LE edema Abdomen:  abdomen soft, nontender; bowel sounds normal; no masses or organomegaly Genital: Defer to GYN Musculoskeletal:  symmetrical muscle groups noted without atrophy or deformity, no TTP over the left knee, normal active and passive range of motion, no effusion or deformity Extremities:  no clubbing, cyanosis, or edema, no deformities, no skin discoloration Neuro:  gait normal; deep tendon reflexes normal and symmetric, 5/5 strength throughout Psych: well oriented with normal range of affect and appropriate judgment/insight  Assessment and Plan  Well adult exam - Plan: TSH, T4, free, CBC, Comprehensive metabolic panel with GFR, Lipid panel  Need for influenza vaccination - Plan: Flu vaccine trivalent PF, 6mos and older(Flulaval,Afluria,Fluarix,Fluzone)  Instability of left knee joint - Plan: Ambulatory referral to Physical Therapy  Thyromegaly - Plan: US   THYROID   Need for pneumococcal vaccination - Plan: Pneumococcal conjugate vaccine 20-valent (Prevnar 20)  Need for Tdap vaccination - Plan: Tdap vaccine greater than or equal to 7yo IM   Well 63 y.o. female. Counseled on diet and exercise. Advanced directive form provided today.  PCV20 and Tdap today.  Thyromegaly: Check an ultrasound.  Check labs.  Follow-up pending results. Knee instability: Stretches and exercises given today.  Lingering pain likely related to healing soft tissue versus bursitis.  Refer to physical therapy as she is going on a cruise next month. Flu shot today. Other orders as above. Follow up in 6 mo. The patient voiced understanding and agreement to the plan.  Mabel Mt [place], DO 03/04/24 12:17 PM

## 2024-03-04 NOTE — Patient Instructions (Addendum)
 Give us  2-3 business days to get the results of your labs back.   Keep the diet clean and stay active.  Please get me a copy of your advanced directive form at your convenience.   If you do not hear anything about your referral in the next 1-2 weeks, call our office and ask for an update.  Someone will reach out regarding your US  scheduling.   Let us  know if you need anything.  Knee Exercises It is normal to feel mild stretching, pulling, tightness, or discomfort as you do these exercises, but you should stop right away if you feel sudden pain or your pain gets worse.  STRETCHING AND RANGE OF MOTION EXERCISES  These exercises warm up your muscles and joints and improve the movement and flexibility of your knee. These exercises also help to relieve pain, numbness, and tingling. Exercise A: Knee Extension, Prone  Lie on your abdomen on a bed. Place your left / right knee just beyond the edge of the surface so your knee is not on the bed. You can put a towel under your left / right thigh just above your knee for comfort. Relax your leg muscles and allow gravity to straighten your knee. You should feel a stretch behind your left / right knee. Hold this position for 30 seconds. Scoot up so your knee is supported between repetitions. Repeat 2 times. Complete this stretch 3 times per week. Exercise B: Knee Flexion, Active     Lie on your back with both knees straight. If this causes back discomfort, bend your left / right knee so your foot is flat on the floor. Slowly slide your left / right heel back toward your buttocks until you feel a gentle stretch in the front of your knee or thigh. Hold this position for 30 seconds. Slowly slide your left / right heel back to the starting position. Repeat 2 times. Complete this exercise 3 times per week. Exercise C: Quadriceps, Prone     Lie on your abdomen on a firm surface, such as a bed or padded floor. Bend your left / right knee and hold your  ankle. If you cannot reach your ankle or pant leg, loop a belt around your foot and grab the belt instead. Gently pull your heel toward your buttocks. Your knee should not slide out to the side. You should feel a stretch in the front of your thigh and knee. Hold this position for 30 seconds. Repeat 2 times. Complete this stretch 3 times per week. Exercise D: Hamstring, Supine  Lie on your back. Loop a belt or towel over the ball of your left / right foot. The ball of your foot is on the walking surface, right under your toes. Straighten your left / right knee and slowly pull on the belt to raise your leg until you feel a gentle stretch behind your knee. Do not let your left / right knee bend while you do this. Keep your other leg flat on the floor. Hold this position for 30 seconds. Repeat 2 times. Complete this stretch 3 times per week. STRENGTHENING EXERCISES  These exercises build strength and endurance in your knee. Endurance is the ability to use your muscles for a long time, even after they get tired. Exercise E: Quadriceps, Isometric     Lie on your back with your left / right leg extended and your other knee bent. Put a rolled towel or small pillow under your knee if told by your  health care provider. Slowly tense the muscles in the front of your left / right thigh. You should see your kneecap slide up toward your hip or see increased dimpling just above the knee. This motion will push the back of the knee toward the floor. For 3 seconds, keep the muscle as tight as you can without increasing your pain. Relax the muscles slowly and completely. Repeat for 10 total reps Repeat 2 ti mes. Complete this exercise 3 times per week. Exercise F: Straight Leg Raises - Quadriceps  Lie on your back with your left / right leg extended and your other knee bent. Tense the muscles in the front of your left / right thigh. You should see your kneecap slide up or see increased dimpling just above the  knee. Your thigh may even shake a bit. Keep these muscles tight as you raise your leg 4-6 inches (10-15 cm) off the floor. Do not let your knee bend. Hold this position for 3 seconds. Keep these muscles tense as you lower your leg. Relax your muscles slowly and completely after each repetition. 10 total reps. Repeat 2 times. Complete this exercise 3 times per week.  Exercise G: Hamstring Curls     If told by your health care provider, do this exercise while wearing ankle weights. Begin with 5 lb weights (optional). Then increase the weight by 1 lb (0.5 kg) increments. Do not wear ankle weights that are more than 20 lbs to start with. Lie on your abdomen with your legs straight. Bend your left / right knee as far as you can without feeling pain. Keep your hips flat against the floor. Hold this position for 3 seconds. Slowly lower your leg to the starting position. Repeat for 10 reps.  Repeat 2 times. Complete this exercise 3 times per week. Exercise H: Squats (Quadriceps)  Stand in front of a table, with your feet and knees pointing straight ahead. You may rest your hands on the table for balance but not for support. Slowly bend your knees and lower your hips like you are going to sit in a chair. Keep your weight over your heels, not over your toes. Keep your lower legs upright so they are parallel with the table legs. Do not let your hips go lower than your knees. Do not bend lower than told by your health care provider. If your knee pain increases, do not bend as low. Hold the squat position for 1 second. Slowly push with your legs to return to standing. Do not use your hands to pull yourself to standing. Repeat 2 times. Complete this exercise 3 times per week. Exercise I: Wall Slides (Quadriceps)     Lean your back against a smooth wall or door while you walk your feet out 18-24 inches (46-61 cm) from it. Place your feet hip-width apart. Slowly slide down the wall or door until your  knees Repeat 2 times. Complete this exercise every other day. Exercise K: Straight Leg Raises - Hip Abductors  Lie on your side with your left / right leg in the top position. Lie so your head, shoulder, knee, and hip line up. You may bend your bottom knee to help you keep your balance. Roll your hips slightly forward so your hips are stacked directly over each other and your left / right knee is facing forward. Leading with your heel, lift your top leg 4-6 inches (10-15 cm). You should feel the muscles in your outer hip lifting. Do not let  your foot drift forward. Do not let your knee roll toward the ceiling. Hold this position for 3 seconds. Slowly return your leg to the starting position. Let your muscles relax completely after each repetition. 10 total reps. Repeat 2 times. Complete this exercise 3 times per week. Exercise J: Straight Leg Raises - Hip Extensors  Lie on your abdomen on a firm surface. You can put a pillow under your hips if that is more comfortable. Tense the muscles in your buttocks and lift your left / right leg about 4-6 inches (10-15 cm). Keep your knee straight as you lift your leg. Hold this position for 3 seconds. Slowly lower your leg to the starting position. Let your leg relax completely after each repetition. Repeat 2 times. Complete this exercise 3 times per week. Document Released: 05/03/2005 Document Revised: 03/13/2016 Document Reviewed: 04/25/2015 Elsevier Interactive Patient Education  2017 [employer].

## 2024-03-06 ENCOUNTER — Telehealth (HOSPITAL_BASED_OUTPATIENT_CLINIC_OR_DEPARTMENT_OTHER): Payer: Self-pay

## 2024-03-19 ENCOUNTER — Ambulatory Visit

## 2024-07-28 ENCOUNTER — Other Ambulatory Visit (HOSPITAL_BASED_OUTPATIENT_CLINIC_OR_DEPARTMENT_OTHER)

## 2024-09-01 ENCOUNTER — Ambulatory Visit: Admitting: Family Medicine
